# Patient Record
Sex: Female | Born: 1935
Health system: Southern US, Community
[De-identification: ages and names within clinical notes are randomized; demographics above are authoritative.]

## PROBLEM LIST (undated history)

## (undated) DIAGNOSIS — I2510 Atherosclerotic heart disease of native coronary artery without angina pectoris: Secondary | ICD-10-CM

## (undated) DIAGNOSIS — K219 Gastro-esophageal reflux disease without esophagitis: Secondary | ICD-10-CM

## (undated) DIAGNOSIS — K146 Glossodynia: Secondary | ICD-10-CM

## (undated) DIAGNOSIS — F419 Anxiety disorder, unspecified: Secondary | ICD-10-CM

## (undated) DIAGNOSIS — R1310 Dysphagia, unspecified: Secondary | ICD-10-CM

## (undated) DIAGNOSIS — G4700 Insomnia, unspecified: Secondary | ICD-10-CM

## (undated) DIAGNOSIS — I10 Essential (primary) hypertension: Secondary | ICD-10-CM

## (undated) DIAGNOSIS — I4891 Unspecified atrial fibrillation: Secondary | ICD-10-CM

## (undated) HISTORY — DX: Essential (primary) hypertension: I10

## (undated) HISTORY — DX: Unspecified atrial fibrillation: I48.91

## (undated) HISTORY — DX: Anxiety disorder, unspecified: F41.9

## (undated) HISTORY — DX: Dysphagia, unspecified: R13.10

## (undated) HISTORY — DX: Glossodynia: K14.6

## (undated) HISTORY — PX: TONSILLECTOMY: SUR1361

## (undated) HISTORY — PX: KIDNEY STONE SURGERY: SHX686

## (undated) HISTORY — DX: Atherosclerotic heart disease of native coronary artery without angina pectoris: I25.10

## (undated) HISTORY — DX: Insomnia, unspecified: G47.00

## (undated) HISTORY — PX: BREAST LUMPECTOMY: SHX2

## (undated) HISTORY — DX: Gastro-esophageal reflux disease without esophagitis: K21.9

---

## 1998-06-21 ENCOUNTER — Other Ambulatory Visit: Admission: RE | Admit: 1998-06-21 | Discharge: 1998-06-21 | Payer: Self-pay | Admitting: *Deleted

## 1999-06-24 ENCOUNTER — Other Ambulatory Visit: Admission: RE | Admit: 1999-06-24 | Discharge: 1999-06-24 | Payer: Self-pay | Admitting: *Deleted

## 1999-07-05 ENCOUNTER — Encounter: Payer: Self-pay | Admitting: *Deleted

## 1999-07-05 ENCOUNTER — Encounter: Admission: RE | Admit: 1999-07-05 | Discharge: 1999-07-05 | Payer: Self-pay | Admitting: *Deleted

## 2000-07-02 ENCOUNTER — Other Ambulatory Visit: Admission: RE | Admit: 2000-07-02 | Discharge: 2000-07-02 | Payer: Self-pay | Admitting: *Deleted

## 2000-07-06 ENCOUNTER — Encounter: Admission: RE | Admit: 2000-07-06 | Discharge: 2000-07-06 | Payer: Self-pay | Admitting: *Deleted

## 2001-07-07 ENCOUNTER — Encounter: Admission: RE | Admit: 2001-07-07 | Discharge: 2001-07-07 | Payer: Self-pay | Admitting: Obstetrics & Gynecology

## 2001-07-07 ENCOUNTER — Encounter: Payer: Self-pay | Admitting: Obstetrics & Gynecology

## 2002-08-24 ENCOUNTER — Encounter: Admission: RE | Admit: 2002-08-24 | Discharge: 2002-08-24 | Payer: Self-pay | Admitting: Family Medicine

## 2002-08-24 ENCOUNTER — Encounter: Payer: Self-pay | Admitting: Family Medicine

## 2003-09-20 ENCOUNTER — Encounter: Admission: RE | Admit: 2003-09-20 | Discharge: 2003-09-20 | Payer: Self-pay | Admitting: Family Medicine

## 2005-01-27 ENCOUNTER — Encounter: Admission: RE | Admit: 2005-01-27 | Discharge: 2005-01-27 | Payer: Self-pay | Admitting: Family Medicine

## 2006-04-21 ENCOUNTER — Encounter: Admission: RE | Admit: 2006-04-21 | Discharge: 2006-04-21 | Payer: Self-pay | Admitting: Family Medicine

## 2006-04-28 ENCOUNTER — Encounter: Admission: RE | Admit: 2006-04-28 | Discharge: 2006-04-28 | Payer: Self-pay | Admitting: Family Medicine

## 2006-12-31 ENCOUNTER — Emergency Department (HOSPITAL_COMMUNITY): Admission: EM | Admit: 2006-12-31 | Discharge: 2006-12-31 | Payer: Self-pay | Admitting: Emergency Medicine

## 2007-02-02 ENCOUNTER — Encounter (INDEPENDENT_AMBULATORY_CARE_PROVIDER_SITE_OTHER): Payer: Self-pay | Admitting: Internal Medicine

## 2007-02-02 ENCOUNTER — Inpatient Hospital Stay (HOSPITAL_COMMUNITY): Admission: EM | Admit: 2007-02-02 | Discharge: 2007-02-06 | Payer: Self-pay | Admitting: Emergency Medicine

## 2007-03-12 ENCOUNTER — Ambulatory Visit (HOSPITAL_COMMUNITY): Admission: RE | Admit: 2007-03-12 | Discharge: 2007-03-12 | Payer: Self-pay | Admitting: Interventional Cardiology

## 2007-03-12 ENCOUNTER — Encounter (INDEPENDENT_AMBULATORY_CARE_PROVIDER_SITE_OTHER): Payer: Self-pay | Admitting: Interventional Cardiology

## 2007-04-26 ENCOUNTER — Encounter: Admission: RE | Admit: 2007-04-26 | Discharge: 2007-04-26 | Payer: Self-pay | Admitting: Family Medicine

## 2007-10-14 ENCOUNTER — Encounter: Admission: RE | Admit: 2007-10-14 | Discharge: 2007-10-14 | Payer: Self-pay | Admitting: Interventional Cardiology

## 2007-10-25 ENCOUNTER — Other Ambulatory Visit: Admission: RE | Admit: 2007-10-25 | Discharge: 2007-10-25 | Payer: Self-pay | Admitting: Family Medicine

## 2008-05-05 ENCOUNTER — Encounter: Admission: RE | Admit: 2008-05-05 | Discharge: 2008-05-05 | Payer: Self-pay | Admitting: Family Medicine

## 2009-12-20 ENCOUNTER — Encounter: Admission: RE | Admit: 2009-12-20 | Discharge: 2009-12-20 | Payer: Self-pay | Admitting: Family Medicine

## 2010-07-03 ENCOUNTER — Ambulatory Visit: Payer: Self-pay | Admitting: Internal Medicine

## 2010-07-03 ENCOUNTER — Inpatient Hospital Stay (HOSPITAL_COMMUNITY): Admission: AD | Admit: 2010-07-03 | Discharge: 2010-07-06 | Payer: Self-pay | Admitting: Interventional Cardiology

## 2010-07-05 ENCOUNTER — Encounter (INDEPENDENT_AMBULATORY_CARE_PROVIDER_SITE_OTHER): Payer: Self-pay | Admitting: Interventional Cardiology

## 2010-07-10 ENCOUNTER — Encounter: Payer: Self-pay | Admitting: Internal Medicine

## 2010-07-12 ENCOUNTER — Telehealth: Payer: Self-pay | Admitting: Internal Medicine

## 2010-07-16 ENCOUNTER — Encounter (INDEPENDENT_AMBULATORY_CARE_PROVIDER_SITE_OTHER): Payer: Self-pay | Admitting: *Deleted

## 2010-07-31 ENCOUNTER — Telehealth (INDEPENDENT_AMBULATORY_CARE_PROVIDER_SITE_OTHER): Payer: Self-pay | Admitting: *Deleted

## 2010-08-09 ENCOUNTER — Encounter: Payer: Self-pay | Admitting: Internal Medicine

## 2010-08-09 ENCOUNTER — Ambulatory Visit: Admission: RE | Admit: 2010-08-09 | Discharge: 2010-08-09 | Payer: Self-pay | Source: Home / Self Care

## 2010-08-25 ENCOUNTER — Encounter: Payer: Self-pay | Admitting: Family Medicine

## 2010-09-05 NOTE — Miscellaneous (Signed)
Summary: Device preload  Clinical Lists Changes  Observations: Added new observation of PPM INDICATN: Brady (07/10/2010 13:04) Added new observation of MAGNET RTE: BOL 85 ERI 65 (07/10/2010 13:04) Added new observation of PPMLEADSTAT2: active (07/10/2010 13:04) Added new observation of PPMLEADSER2: [id number] (07/10/2010 13:04) Added new observation of PPMLEADMOD2: 5076  (07/10/2010 13:04) Added new observation of PPMLEADLOC2: RV  (07/10/2010 13:04) Added new observation of PPMLEADSTAT1: active  (07/10/2010 13:04) Added new observation of PPMLEADSER1: [id number]  (07/10/2010 13:04) Added new observation of PPMLEADMOD1: 5076  (07/10/2010 13:04) Added new observation of PPMLEADLOC1: RA  (07/10/2010 13:04) Added new observation of PPM IMP MD: Lewayne Bunting, MD  (07/10/2010 13:04) Added new observation of PPMLEADDOI2: 07/05/2010  (07/10/2010 13:04) Added new observation of PPMLEADDOI1: 07/05/2010  (07/10/2010 13:04) Added new observation of PPM DOI: 07/05/2010  (07/10/2010 13:04) Added new observation of PPM SERL#: [id number]  (07/10/2010 13:04) Added new observation of PPM MODL#: [id number]  (07/10/2010 65:78) Added new observation of PACEMAKERMFG: Medtronic  (07/10/2010 13:04) Added new observation of PACEMAKER MD: Lewayne Bunting, MD  (07/10/2010 13:04)      PPM Specifications Following MD:  Lewayne Bunting, MD     PPM Vendor:  Medtronic     PPM Model Number:  [id number]     PPM Serial Number:  [id number] PPM DOI:  07/05/2010     PPM Implanting MD:  Lewayne Bunting, MD  Lead 1    Location: RA     DOI: 07/05/2010     Model #: [id number]     Serial #: [id number]     Status: active Lead 2    Location: RV     DOI: 07/05/2010     Model #: [id number]     Serial #: [id number]     Status: active  Magnet Response Rate:  BOL 85 ERI 65  Indications:  Huston Foley

## 2010-09-05 NOTE — Progress Notes (Signed)
  Phone Note From Other Clinic   Summary of Call: per Florentina Addison at [facility] pt to be followed by [facility] for pacemaker after 3 mth ov w/gt. Vella Kohler  July 12, 2010 2:02 PM

## 2010-09-05 NOTE — Progress Notes (Signed)
  Phone Note From Other Clinic   Caller: KATY Summary of Call: PER KATY AT [facility] NO FOLLOWUP TO BE SCHEDULED HERE.  ALL PACER FOLLOWUP TO BE DONE AT [facility]. Vella Kohler  July 31, 2010 11:30 AM

## 2010-09-05 NOTE — Cardiovascular Report (Signed)
Summary: Office Visit   Office Visit   Imported By: Roderic Ovens 08/23/2010 16:28:20  _____________________________________________________________________  External Attachment:    Type:   Image     Comment:   External Document

## 2010-09-05 NOTE — Letter (Signed)
Summary: Appointment - Reminder 2  [facility], Main Office  [address]   [place], [place] [address]   Phone: [phone]  Fax: [phone]     July 16, 2010 MRN: [id number]   [name] [address] [place], [place]  [address]   Dear Ms. Packard,  Our records indicate that it is time to schedule a wound check appointment,with our Pacer Clinic, from your recent surgery with Dr. Ladona Ridgel.  Dr.Taylor also recommended that you follow up with him in 3 months. It is very important that we reach you to schedule this appointment. We look forward to participating in your health care needs. Please contact us at the number listed above at your earliest convenience to schedule your appointment.  If you are unable to make an appointment at this time, give [place] a call so we can update our records.  Unable to reach by phone at [phone]   Sincerely,   [facility] Scheduling Team

## 2010-09-05 NOTE — Procedures (Signed)
Summary: wch/d/c 07/06/2010   Current Medications (verified): 1)  Warfarin Sodium 2.5 Mg Tabs (Warfarin Sodium) .... Take As Directed By Coumadin Clinic 2)  Furosemide 40 Mg Tabs (Furosemide) .... Take 1 Tablet By Mouth Once Daily 3)  Diltiazem Hcl Er Beads 240 Mg Xr24h-Cap (Diltiazem Hcl Er Beads) .... Take 1 Capsule By Mouth Once Daily 4)  Losartan Potassium 50 Mg Tabs (Losartan Potassium) .... Take 1 Tablet By Mouth Two Times A Day 5)  Medroxyprogesterone Acetate 10 Mg Tabs (Medroxyprogesterone Acetate) .... Take 1 Tablet By Mouth Once Daily 6)  Lyrica 50 Mg Caps (Pregabalin) .... Take 1 Capusle By Mouth Two Times A Day 7)  Clonazepam 0.5 Mg Tabs (Clonazepam) .... Take 1 Tablet By Mouth Three Times A Day As Needed 8)  Premarin 0.625 Mg/gm Crea (Estrogens, Conjugated) .... Take 1 Tablet By Mouth Once Daily For 3 Weeks, Off 1 Week Then Repeat Days 1-25 9)  Melatonin 3 Mg Tabs (Melatonin) .... Take 1 Tablet By Mouth Once Daily 10)  Metanx 3-35-2 Mg Tabs (L-Methylfolate-B6-B12) .... Take 1 Tablet By Mouth Once Daily 11)  Calcium Carbonate-Vitamin D 600-400 Mg-Unit Tabs (Calcium Carbonate-Vitamin D) .... Take 1 Tablet By Mouth Two Times A Day 12)  Fish Oil 1000 Mg Caps (Omega-3 Fatty Acids) .... Take 2 Capsules By Mouth Once Daily 13)  Coq10 30 Mg Caps (Coenzyme Q10) .... Take 1 Capusle By Mouth Once Daily 14)  Magnesium 250 Mg Tabs (Magnesium) .... Take 1 Tablet By Mouth Once Daily 15)  Alpha-Lipoic Acid 200 Mg Caps (Alpha-Lipoic Acid) .... Take 1 Tablet By Mouth Once Daily 16)  Folic Acid 800 Mcg Tabs (Folic Acid) .... Take 1 Tablet By Mouth Once Daily 17)  Glucosamine-Chondroitin 250-200 Mg Caps (Glucosamine-Chondroitin) .... Take 2 Tablets By Mouth Once Daily 18)  Vitamin C 500 Mg Tabs (Ascorbic Acid) .... Take 1 Tablet By Mouth Once Daily 19)  B Complex  Tabs (B Complex Vitamins) .... Take 1 Tablet By Mouth Once Daily 20)  Zoloft 100 Mg Tabs (Sertraline Hcl) .... Take 1/4 To 1/2 Tablet By  Mouth Once Daily  Allergies (verified): 1)  ! Cortisone  PPM Specifications Following MD:  Lewayne Bunting, MD     PPM Vendor:  [employer]tronic     PPM Model Number:  [id number]     PPM Serial Number:  [id number] PPM DOI:  07/05/2010     PPM Implanting MD:  Lewayne Bunting, MD  Lead 1    Location: RA     DOI: 07/05/2010     Model #: [id number]     Serial #: [id number]     Status: active Lead 2    Location: RV     DOI: 07/05/2010     Model #: [id number]     Serial #: [id number]     Status: active  Magnet Response Rate:  BOL 85 ERI 65  Indications:  Huston Foley   PPM Follow Up Battery Voltage:  2.78 V     Battery Est. Longevity:  8.5 YRS       PPM Device Measurements Atrium  Amplitude: 2.00 mV, Impedance: 590 ohms, Threshold: 1.00 V at 0.40 msec Right Ventricle  Amplitude: 15.68 mV, Impedance: 828 ohms, Threshold: 0.50 V at 0.40 msec  Episodes MS Episodes:  0     Ventricular High Rate:  0     Atrial Pacing:  99.4%     Ventricular Pacing:  99.8%  Parameters Mode:  DDDR     Lower Rate Limit:  60  Upper Rate Limit:  130 Paced AV Delay:  150     Sensed AV Delay:  120 Tech Comments:  WOUND CHECK---SITE HEALED WITH NO REDNESS OR SWELLING.  NORMAL DEVICE FUNCTION.  VP 99.8%---CHANGED PAV DELAY TO 350 TO PROMOTE ON INTRINSIC.  PT TO BE FOLLOWED BY [facility]--3 MTH FOLLOWUP TO BE SCHEDULED THERE. Vella Kohler  August 09, 2010 10:27 AM

## 2010-10-15 LAB — BASIC METABOLIC PANEL
BUN: 13 mg/dL (ref 6–23)
BUN: 13 mg/dL (ref 6–23)
BUN: 15 mg/dL (ref 6–23)
CO2: 27 mEq/L (ref 19–32)
CO2: 28 mEq/L (ref 19–32)
CO2: 29 mEq/L (ref 19–32)
Calcium: 8.8 mg/dL (ref 8.4–10.5)
Chloride: 86 mEq/L — ABNORMAL LOW (ref 96–112)
Chloride: 90 mEq/L — ABNORMAL LOW (ref 96–112)
Chloride: 92 mEq/L — ABNORMAL LOW (ref 96–112)
Creatinine, Ser: 0.75 mg/dL (ref 0.4–1.2)
Creatinine, Ser: 0.77 mg/dL (ref 0.4–1.2)
Creatinine, Ser: 0.82 mg/dL (ref 0.4–1.2)
GFR calc Af Amer: >60 mL/min (ref >60)
GFR calc Af Amer: >60 mL/min (ref >60)
GFR calc Af Amer: >60 mL/min (ref >60)
GFR calc Af Amer: >60 mL/min (ref >60)
GFR calc non Af Amer: >60 mL/min (ref >60)
Glucose, Bld: 109 mg/dL — ABNORMAL HIGH (ref 70–99)
Potassium: 3.7 mEq/L (ref 3.5–5.1)
Sodium: 123 mEq/L — ABNORMAL LOW (ref 135–145)
Sodium: 124 mEq/L — ABNORMAL LOW (ref 135–145)
Sodium: 129 mEq/L — ABNORMAL LOW (ref 135–145)

## 2010-10-15 LAB — PROTIME-INR: INR: 2.93 — ABNORMAL HIGH (ref 0.00–1.49)

## 2010-10-16 LAB — DIFFERENTIAL
Basophils Relative: 0 % (ref 0–1)
Eosinophils Absolute: 0 10*3/uL (ref 0.0–0.7)
Eosinophils Relative: 0 % (ref 0–5)
Monocytes Absolute: 0.8 10*3/uL (ref 0.1–1.0)
Monocytes Relative: 9 % (ref 3–12)
Neutro Abs: 7.2 10*3/uL (ref 1.7–7.7)

## 2010-10-16 LAB — COMPREHENSIVE METABOLIC PANEL
ALT: 168 U/L — ABNORMAL HIGH (ref 0–35)
AST: 109 U/L — ABNORMAL HIGH (ref 0–37)
Albumin: 3.7 g/dL (ref 3.5–5.2)
Alkaline Phosphatase: 86 U/L (ref 39–117)
BUN: 13 mg/dL (ref 6–23)
Calcium: 8.4 mg/dL (ref 8.4–10.5)
Chloride: 84 mEq/L — ABNORMAL LOW (ref 96–112)
Creatinine, Ser: 0.7 mg/dL (ref 0.4–1.2)
Total Bilirubin: 0.8 mg/dL (ref 0.3–1.2)
Total Protein: 6.6 g/dL (ref 6.0–8.3)

## 2010-10-16 LAB — CBC
HCT: 36.8 % (ref 36.0–46.0)
MCH: 31.6 pg (ref 26.0–34.0)
MCHC: 36.1 g/dL — ABNORMAL HIGH (ref 30.0–36.0)
Platelets: 202 10*3/uL (ref 150–400)
RBC: 4.21 MIL/uL (ref 3.87–5.11)
RDW: 14.9 % (ref 11.5–15.5)
WBC: 9 10*3/uL (ref 4.0–10.5)

## 2010-10-16 LAB — PROTIME-INR: INR: 2.75 — ABNORMAL HIGH (ref 0.00–1.49)

## 2010-12-17 NOTE — Consult Note (Signed)
NAMEANJELICA, [name] NO.:  [id number]   MEDICAL RECORD NO.:  [id number]          PATIENT TYPE:  INP   LOCATION:  1410                         FACILITY:  [facility]   PHYSICIAN:  [name], M.D.   DATE OF BIRTH:  May 15, 1919   DATE OF CONSULTATION:  02/03/2007  DATE OF DISCHARGE:                                 CONSULTATION   We were asked to see [name] today in consultation by Dr. Suanne Marker for  esophageal reflux.   HISTORY OF PRESENT ILLNESS:  This is an 77 year old female in excellent  health who has experienced reflux since last fall. It has become much  more severe in the last 6 weeks. She was admitted to [facility] on July 1 for rapid heart rate. The patient tells me that she  has lost 6 pounds in 6 weeks which is significant because she is at a  low weight to begin with. She was gone from 127-121. She also tells me  that she used to do daily exercise, walk approximately two miles a day  and do sit ups. Lately she has stopped exercising due to her symptoms of  burning in her mouth.  She has lost sleep and experiences frequent  interruptions of sleep due to her symptoms.  She has had diarrhea which  has been helped with Pepto-Bismol. The patient.  questioned if her  diarrhea could have been due to Nexium and Reglan. She describes her  symptoms as severe acid taste and burning on her tongue.  She denies any  dysphasia, loss of appetite. States that eating a little actually helps  her symptoms.  She denies any epigastric or abdominal pain.  Denies any  nausea or vomiting or hematemesis.  She denies any melena or  hematochezia.  Denies any back pain, recent illness, or sick contacts.  The patient takes no NSAIDs.  She does use Tums and Pepto-Bismol along  with Nexium and Reglan for her symptoms.  She recently stopped the  Reglan due to her rapid heart rate.  The patient describes her self as  having a driver-driver type personality and possibly a  high stress  level. She reports that she started drinking lots of V8 juice 6-8 weeks  ago, also reports eating a good deal of ascitic foods.   PAST MEDICAL HISTORY:  Significant for:  1. Nephrolithiasis.  2. Hypertension.  3. GERD.  4. She had a tonsillectomy.  5. Left breast lumpectomy.  6. 25+ years ago she had an endoscopy which was positive for hiatal      hernia.  7. Approximately 5-8 years ago she had a colonoscopy done by the      [facility]. She did not report the results of that colonoscopy.   She reports that she has a sister who passed with colon cancer.   CURRENT MEDICATIONS:  1. Nexium 40 b.i.d.  2. Reglan 5 mg 3 times a day.  3. Zantac.  4. Tums.  5. Daily Pepto-Bismol.  6. Hydrochlorothiazide/triamalprazolam.  7. Vitamins E, C, A, chromium, glucosamine and chondroitin, calcium,  iron, folic acid.   ALLERGIES:  The patient is allergic to CORTISONE.   REVIEW OF SYSTEMS:  Significant for concern about the looks of her  tongue.  Admitted with a rapid irregular heart rate.   SOCIAL HISTORY:  No alcohol.  No tobacco.  Married with five children  and grandchildren.  A very active person.   FAMILY HISTORY:  Sister with colon cancer.   PHYSICAL EXAMINATION:  GENERAL:  Alert and oriented in no apparent  distress.  Family is present.  VITAL SIGNS:  Temperature 98.1, pulse 71, respirations 18, blood  pressure is 147/93.  CARDIOVASCULAR:  Sounds tachy and irregular.  LUNGS:  Clear to auscultation anteriorly.  ABDOMEN:  Increased active bowel sounds, nontender, nondistended.  No  masses.  RECTAL:  Exam showed no masses.  No stool. Fluid was guaiac negative.   Current labs show a sodium of 127, potassium 3.9, BUN 19, creatinine  0.64, glucose 119.  TSH 0.879.  Her hemoglobin was 14.9 on admission.   ASSESSMENT:  Dr. Wandalee Ferdinand has seen and examined the patient, collected  a history.  He believes this is acid reflux, possibly due to a hiatal  hernia.  Furthermore, the patient has hyponatremia and hypertension.   PLAN:  Per family's request. will do upper endoscopy tomorrow morning at  approximately 9:00 a.m.   Thanks very much for this consultation.      Stephani Police, PA    ______________________________  [name], M.D.    MLY/MEDQ  D:  02/03/2007  T:  02/04/2007  Job:  [id number]   cc:   L. [name], M.D.  Fax: [phone]   Kela Millin, M.D.

## 2010-12-17 NOTE — Op Note (Signed)
NAMEELANE, PEABODY NO.:  [id number]   MEDICAL RECORD NO.:  [id number]          PATIENT TYPE:  INP   LOCATION:  1410                         FACILITY:  [facility]   PHYSICIAN:  Graylin Shiver, M.D.   DATE OF BIRTH:  [birth year]   DATE OF PROCEDURE:  02/04/2007  DATE OF DISCHARGE:  02/06/2007                               OPERATIVE REPORT   Esophagogastroduodenoscopy with biopsy.   INDICATIONS:  Heartburn.   Informed consent was obtained after explanation of the risks of  bleeding, infection and perforation.   PREMEDICATION:  Fentanyl 50 mcg IV, Versed 3 mg IV.   PROCEDURE:  With the patient in the left lateral decubitus position, the  Pentax gastroscope was inserted into the oropharynx and passed into the  esophagus.  It was advanced down the esophagus, then into the stomach  and into the duodenum.  The second portion and bulb of the duodenum were  normal.  The stomach showed some mild hyperemia in the antrum.  A biopsy  was obtained for CLO-test.  The stomach also showed a hiatal hernia,  which was very small.  The esophagus looked normal in its entirety.  She  tolerated the procedure well without complications.   IMPRESSION:  1. One mild hyperemia of the antrum.  2. Small hiatal hernia.           ______________________________  Graylin Shiver, M.D.     SFG/MEDQ  D:  02/08/2007  T:  02/09/2007  Job:  [id number]

## 2010-12-17 NOTE — Discharge Summary (Signed)
[name], [name]              ACCOUNT NO.:  [id number]   MEDICAL RECORD NO.:  [id number]          PATIENT TYPE:  INP   LOCATION:  1410                         FACILITY:  [facility]   PHYSICIAN:  Kela Millin, M.D.DATE OF BIRTH:  Apr 29, 1919   DATE OF ADMISSION:  02/01/2007  DATE OF DISCHARGE:  02/06/2007                               DISCHARGE SUMMARY   DISCHARGE DIAGNOSES:  1. Atrial fibrillation, new onset.  2. Gastroesophageal reflux disease with a small hiatal hernia per EGD.  3. Hypertension.  4. Hyponatremia.  5. History of nephrolithiasis.  6. History of left breast cyst - status post excision.   PROCEDURE/STUDIES:  1. 2D echocardiogram - overall left ventricular systolic function      normal.  Ejection fraction is stated at 60 percent.  No left      ventricular regional wall function abnormality.  Moderate pulmonary      hypertension noted.  Right ventricular systolic pressure is 50.  2. EGD done on 02/04/2007 - Per Dr. Evette Cristal, small hiatal hernia, mild      hyperemia, biopsy with Clo test done.  Otherwise normal EGD.  Clo      test negative.   CONSULTATIONS:  1. Cardiology - Corky Crafts, MD  2. Gastroenterology - Graylin Shiver, M.D.   HISTORY/ADMISSION PHYSICAL EXAM:  The patient is an 77 year old white  female who presented with complaints of her heart racing and mild  shortness of breath.  She reported that thereafter she subsequently  after dinner, returned to lay down in bed and noticed again that her  heart was racing.  She called her pharmacist to find out about drug  reactions between Nexium and Reglan.  Because of her persistent rapid  heart rate, she went to the [facility] and she was found to be  in atrial fibrillation with rapid ventricular response and was referred  to the [facility] Emergency Room.   Her physical exam upon admission as per Dr. Debby Bud reveals in general,  well-nourished, well-developed white female looking younger  than her  stated age in no acute distress.  Person's findings on exam were on  cardiovascular exam noted to have a very rapid tachycardia which seemed  regular on exam.  Her lungs were clear to auscultation no crackles, no  wheezes or rhonchi.  The rest of the physical exam was reported to be  within normal limits.   LABORATORY DATA:  The laboratory data hemoglobin was 14.9, white blood  cells of 6.5, platelet count of 330, sodium 127, potassium 3.9, chloride  92, CO2 27, BUN of 19, creatinine 0.6, glucose 119.  Her point of care  markers were negative.  Chest x-ray question of early COPD changes.  No  infiltrates or effusion.  On EKG done at the [facility] reveals  atrial fibrillation at a rate 149 and the second EKG at the [facility]  ED reveals atrial fibrillation at the rate of 139.   HOSPITAL COURSE:  1. New onset atrial fibrillation with rapid ventricular response -      Upon admission the patient was started  on a Cardizem drip and      serial cardiac enzymes done to evaluate for MI.  Her cardiac      enzymes were negative for MI.  A 2D echo was done as a result of      series above.  Her heart rate was subsequently controlled on the IV      Cardizem and the patient was then changed to oral Cardizem and      metoprolol also added.  Cardiology was consulted and it was noted      that the patient had had a heart rate drop into the 30s overnight      and so the metoprolol was discontinued and her long acting Cardizem      changed to short acting.  The patient was started on      anticoagulation with heparin upon admission and subsequently      Coumadin also started.  The patient was monitored on telemetry and      off the metoprolol no further bradycardic episodes were recorded      and on followup rounds today Dr. Patty Sermons switched the patient      back to a long acting diltiazem 240 mg and she is to continue this      upon discharge.  The patient's PT INR also monitored  while she was      on anticoagulation and today it became therapeutic at 2.1.  The      heparin has been discontinued and the patient will be discharged on      oral Coumadin at this time.  She is to go to the [facility] Coumadin      Clinic on 02/08/2007 for PT INR and the Coumadin dose to be adjusted      as appropriate.  The patient has been ambulating in the hospital      with no further palpitations, also no shortness of breath.  She has      remained hemodynamically stable and will be discharged at this time      to follow up as an outpatient.  Per Dr. Eldridge Dace if the patient      remains in atrial fibrillation he will consider a cardioversion      after several weeks of Coumadin as outpatient.  2. Gastroesophageal reflux disease - the patient was complaining of      tongue burning and had been on Nexium and Reglan.  Also had been      seen by Dr. Evette Cristal.  The patient's symptoms persisted while in the      hospital and the Reglan had been discontinued because of the      potential for supraventricular tachycardia and the patient was      started on Carafate while in the hospital.  Because her symptoms      persisted, gastroenterology was consulted and Dr. Evette Cristal saw the      patient in the hospital and that EGD was done on 02/04/2007 and the      results stated above.  The patient reported that her symptoms were      better controlled with the Carafate and GI has recommended that she      continues the Nexium as well upon discharge.  3. Hypertension - the patient's blood pressure is well controlled      while in the hospital on her outpatient medications as well as the      diltiazem as above.  4. Hyponatremia -  the patient was treated while in the hospital.      Impression is that this was likely secondary to Maxzide.  The      patient is to follow up with her primary care physician to have her      sodium rechecked and if the hyponatremia is persistent then the      recommendation would  be for the Maxzide to be discontinued per      primary care physician.  The patient remained asymptomatic in the      hospital.   DISCHARGE MEDICATIONS:  1. Diltiazem CD 240 mg one p.o. daily.  2. Coumadin 2.5 mg p.o. q.p.m., PT INR's to be checked at Coumadin      clinic on 02/08/2007 and they will adjust it as appropriate.  3. Carafate 1 gm p.o. q.a.c.  4. The patient to continue outpatient medications - Maxzide to follow      up with her primary care physician of, Nexium and Xanax.  5. The patient instructed to discontinue Reglan.   FOLLOWUP CARE:  1. Coumadin Clinic on 7/7 for PT INR.  2. Dr. Eldridge Dace - the patient to call for appointment.  3. Dr. [name] - in one week, the patient is to call for      appointment.   DISCHARGE CONDITION:  Improved - stable.      Kela Millin, M.D.  Electronically Signed     ACV/MEDQ  D:  02/06/2007  T:  02/06/2007  Job:  [id number]   cc:   L. [name], M.D.  Fax: [phone]   Corky Crafts, MD  Fax: [phone]   Graylin Shiver, M.D.  Fax: [phone]

## 2010-12-17 NOTE — H&P (Signed)
NAMELUDENE, STOKKE NO.:  [id number]   MEDICAL RECORD NO.:  [id number]          PATIENT TYPE:  INP   LOCATION:  1234                         FACILITY:  [facility]   PHYSICIAN:  Rosalyn Gess. Norins, MD  DATE OF BIRTH:  1919/02/11   DATE OF ADMISSION:  02/01/2007  DATE OF DISCHARGE:                              HISTORY & PHYSICAL   CHIEF COMPLAINT:  Rapid heart rate with mild shortness of breath.   HISTORY OF PRESENT ILLNESS:  Ms. Ruland is a delightful 77 year old  married white female who has noticed this evening when lying down that  her heart rate was rapid.  She was able to get up from the bed, go to  dinner, and had no symptoms.  After cleaning up after dinner she  returned to bed to lay down and noticed that her heart rate was rapid  again.  She did call her pharmacist to check to see about drug  interactions between Nexium and Reglan.  Because of persistent rapid  heart rate, she went to the [facility] after hours clinic where she was found  to be in atrial fibrillation with rapid ventricular response and was  subsequently referred to the [facility] emergency department.  On  arrival in the emergency department, she was found to be in rapid atrial  fibrillation and was started on a Cardizem drip.  She is now admitted  for rate control and further evaluation.  Of note, the patient denies  any history of any cardiac problems of any kind.  She has been followed  for the past month for severe reflux which has been treated with PPIs  and Reglan recently started.  She has had a consult with Dr. Evette Cristal.   PAST SURGICAL HISTORY:  1. Tonsillectomy, remote.  2. Surgical removal of kidney stone, remote.  3. Excision of a cyst from the left breast.   PAST MEDICAL HISTORY:  1. Usual childhood diseases and whooping cough.  2. Hypertension.  3. GERD.   CURRENT MEDICATIONS:  1. Xanax 0.25 mg t.i.d. p.r.n.  2. Metoclopramide 5 mg before meals.  3. Nexium 40 mg q.a.m.  4.  Maxzide 37.5/25.   HABITS:  Tobacco:  None.  Alcohol:  None.   FAMILY HISTORY:  Noncontributory.   SOCIAL HISTORY:  The patient lives with her husband.  They are very  independent in their activities of daily living.  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   REVIEW OF SYSTEMS:  Negative for any HEENT, cardiovascular, respiratory,  GI, GU, or musculoskeletal complaints, except as per the HPI.   PHYSICAL EXAMINATION:  VITAL SIGNS:  Temperature 97.1, blood pressure  143/102, heart rate was 137-140, respirations were 20, O2 saturations  98% on room air.  GENERAL:  Well-nourished, well-developed Caucasian female looking  younger than her stated chronologic age, in no acute distress.  HEENT:  Normocephalic, atraumatic.  Conjunctivae and sclerae were clear.  Oropharynx with native dentition in good repair.  No buccal or palatal  lesions were noted.  Posterior pharynx was clear.  NECK:  Supple without thyromegaly.  No lymphadenopathy was noted  in the  cervical or supraclavicular regions.  CHEST:  No deformities were noted.  LUNGS:  Clear with no rales, wheezes, or rhonchi.  BREASTS:  Exam deferred to outpatient evaluation.  CARDIOVASCULAR:  2+ radial pulse.  She had a quiet precordium with no  heave, no thrill.  She had no JVD, no carotid bruits.  The patient had a  very rapid tachycardia which on auscultation seemed regular.  ABDOMEN:  Soft.  No guarding or rebound.  No organosplenomegaly was  noted.  No masses were noted.  PELVIC/RECTAL:  Exams deferred.  EXTREMITIES:  Without clubbing, cyanosis, edema, or deformity.  NEUROLOGIC:  The patient is awake, alert, oriented to person, place,  time, and context, and her exam is nonfocal.   DATABASE:  Hemoglobin 14.9 grams, white count 6500, platelet count  330,000.  Chemistries with sodium 127, potassium 3.9, chloride 92, CO2  24, BUN 19, creatinine 0.6, glucose 119.  CK-MB was 6.6 with troponin  less than 0.05.  CK #2 was 5.8 with  troponin less than 0.05.   Chest x-ray with question of early COPD with no infiltrates or  effusions.  12-lead electrocardiogram #1 performed at [facility] outpatient  was atrial fibrillation with rate of 149.  EKG #2 in the [facility] ED  was atrial fibrillation with a rate of 139.   ASSESSMENT AND PLAN:  Cardiovascular.  Patient with new onset atrial  fibrillation of unknown duration.  The patient remains asymptomatic and  has had no chest pain, no shortness of breath, no syncope, near syncope,  or other symptoms.  On physical exam, aside from her tachycardia, she is  stable.  Plan:  Continue Cardizem intravenously.  We will add thyroid-  stimulating hormone to the patient's laboratory and do regular cycled  enzymes x3.  Will perform a 2-D echocardiogram in the a.m. by [facility]  Cardiology and ask for an [facility] Cardiology consultation.  The patient  will be covered with Lovenox 40 mg subcutaneously every 12 hours,  holding the full anticoagulation until after initial evaluation is  completed.  1. Hypertension.  The patient's blood pressure was elevated initially,      but we will continue with her Cardizem as well as with her Maxzide.      We will add Lopressor 12.5 mg by mouth twice a day for both      pressure and rate control.  2. Gastroesophageal reflux disease.  Patient with severe      gastroesophageal reflux disease and has been on Nexium and Reglan.      Because of the potential for supraventricular tachycardia with      Reglan, will hold at this time.  Will continue Protonix 40 mg every      morning with ranitidine 150 mg every evening as needed.      Rosalyn Gess Norins, MD  Electronically Signed     MEN/MEDQ  D:  02/02/2007  T:  02/02/2007  Job:  [id number]   cc:   L. [name], M.D.  Fax: [phone]

## 2010-12-17 NOTE — Consult Note (Signed)
NAMEBREEA, [name] NO.:  [id number]   MEDICAL RECORD NO.:  [id number]          PATIENT TYPE:  INP   LOCATION:  1410                         FACILITY[facility]  [facility]   PHYSICIAN:  Corky Crafts, MDDATE OF BIRTH:  03/03/1919   DATE OF CONSULTATION:  02/04/2007  DATE OF DISCHARGE:                                 CONSULTATION   REFERRING PHYSICIAN:  Kela Millin, M.D.   REASON FOR CONSULTATION:  Atrial fibrillation.   HISTORY OF PRESENT ILLNESS:  The patient is a 77 year old woman who is  admitted with palpitations.  She was found to be in atrial fibrillation  with rapid ventricular response.  She has been rate-controlled with  Cardizem.  She has been anticoagulated with heparin.  She was also  started on Lopressor.  She did have some heart rates into the 30s last  evening.  She was not symptomatic with it.  She has not had any chest  pain or shortness of breath.  She has really been in good health  overall.  She does report some heartburn type symptoms as well.   PAST MEDICAL HISTORY:  1. Hypertension.  2. GERD.  3. Nephrolithiasis.   PAST SURGICAL HISTORY:  1. Tonsillectomy.  2. Lumpectomy.   CURRENT MEDICATIONS:  1. Nexium 40 mg b.i.d.  2. Reglan.  3. Zantac.  4. Tums.  5. Pepto-Bismol.  6. HCTZ/triamterene.  7. Multiple vitamins.   ALLERGIES:  CORTISONE.   SOCIAL HISTORY:  She does not smoke or drink.  She is married with five  children.  She lives with her husband.  She is very active physically.   FAMILY HISTORY:  No early coronary artery disease.   REVIEW OF SYSTEMS:  The focal weakness.  No rash.  She does have  heartburn symptoms.  She has had some diarrhea.  She denies chest pain  or shortness of breath.  She has had palpitations.  She denies any rash.  All other systems negative.   PHYSICAL EXAMINATION:  VITAL SIGNS:  Blood pressure 127/75, heart rate  ranging from 30s-110.  GENERAL:  She is awake, alert, in no apparent  distress.  HEAD:  Normocephalic, atraumatic. Extraocular is intact.  NECK:  No JVD.  CARDIAC:  Irregularly, irregular rhythm, S1, S2.  LUNGS:  Clear to auscultation bilaterally.  ABDOMEN:  Soft, nontender.  EXTREMITIES:  No edema.  NEUROLOGIC:  No focal deficits.  SKIN:  No rash.   LABORATORY DATA AND X-RAY FINDINGS:  Troponin 0.03 which is normal, CK-  MB 7.3 with a CK of 174.  Additional troponin less than 0.05.  Potassium  3.9, creatinine 0.6.   EKG showed atrial fibrillation with rapid ventricular response.   ASSESSMENT:  An 77 year old with new onset atrial fibrillation.   RECOMMENDATIONS:  1. Continue rate control.  I would decrease her Cardizem to 30 mg q.6      h. given the very slow heart rate that she had.  Will also stop her      beta-blocker.  2. We discussed Coumadin with the patient and her family.  She does  not have any bleeding contraindications.  She is not unsteady on      her feet.  Will initiate Coumadin therapy and heparin      anticoagulation.  3. The patient is also being evaluated by GI for reflux symptoms.  4. Hopefully, the patient will convert back to normal sinus rhythm on      her own.  If she does not, will plan to anticoagulate her for about      a month and then cardiovert her as an outpatient.  5. Will follow the patient while she is in the hospital.      Corky Crafts, MD  Electronically Signed     JSV/MEDQ  D:  02/04/2007  T:  02/05/2007  Job:  [id number]   cc:   L. [name], M.D.  Fax: [phone]

## 2010-12-17 NOTE — Op Note (Signed)
NAMEALIZ, MERITT NO.:  [id number]   MEDICAL RECORD NO.:  [id number]          PATIENT TYPE:  AMB   LOCATION:  ENDO                         FACILITY:  [facility]   PHYSICIAN:  Corky Crafts, MDDATE OF BIRTH:  February 19, 1919   DATE OF PROCEDURE:  03/12/2007  DATE OF DISCHARGE:                               OPERATIVE REPORT   PROCEDURE PERFORMED:  DC cardioversion.   OPERATOR:  Corky Crafts, M.D.   INDICATION:  Atrial fibrillation.   PROCEDURE:  The risks and benefits to cardioversion were explained to  the patient and informed consent was obtained.  The TEE did not show any  intracardiac thrombus.  Defibrillator pads were then placed on the  anterior chest wall and back.  A single 120 joule biphasic shock was  successful in restoring normal sinus rhythm.  There were no apparent  complications.  Anesthesia was administered by Dr. Kathline Magic.   IMPRESSION:  Successful restoration of normal sinus rhythm.   RECOMMENDATIONS:  Continue Coumadin and Cardizem for now.  I will follow  up with the patient in the office.      Corky Crafts, MD  Electronically Signed     JSV/MEDQ  D:  03/12/2007  T:  03/13/2007  Job:  [id number]

## 2011-05-20 LAB — BASIC METABOLIC PANEL
BUN: 16
Chloride: 97
Creatinine, Ser: 0.58
Glucose, Bld: 110 — ABNORMAL HIGH

## 2011-05-20 LAB — CBC
HCT: 40.9
HCT: 41.4
HCT: 42.7
Hemoglobin: 14.2
Hemoglobin: 14.4
MCHC: 34.6
MCHC: 34.8
MCHC: 34.8
MCV: 91.5
MCV: 91.9
MCV: 92.7
Platelets: 295
Platelets: 303
Platelets: 310
RDW: 13.8
RDW: 13.8
RDW: 13.9
WBC: 7.1

## 2011-05-20 LAB — HEPARIN LEVEL (UNFRACTIONATED)
Heparin Unfractionated: 0.57
Heparin Unfractionated: 0.64

## 2011-05-20 LAB — CARDIAC PANEL(CRET KIN+CKTOT+MB+TROPI)
CK, MB: 4.9 — ABNORMAL HIGH
CK, MB: 4.9 — ABNORMAL HIGH
Total CK: 106
Troponin I: 0.04

## 2011-05-20 LAB — CK TOTAL AND CKMB (NOT AT ARMC): Relative Index: 4.2 — ABNORMAL HIGH

## 2011-05-20 LAB — PROTIME-INR: INR: 2.1 — ABNORMAL HIGH

## 2011-05-20 LAB — CLOTEST (H. PYLORI), BIOPSY: Helicobacter screen: NEGATIVE

## 2011-05-21 LAB — POCT CARDIAC MARKERS
CKMB, poc: 5.8
Myoglobin, poc: 117
Myoglobin, poc: 98.8
Operator id: 4661
Operator id: 4761

## 2011-05-21 LAB — PROTIME-INR
INR: 1
Prothrombin Time: 12.8

## 2011-05-21 LAB — CBC
HCT: 43.4
MCHC: 34.2
MCV: 93.3
Platelets: 330
WBC: 6.5

## 2011-05-21 LAB — DIFFERENTIAL
Basophils Relative: 1
Eosinophils Absolute: 0.1
Eosinophils Relative: 2
Lymphs Abs: 1.6
Monocytes Relative: 14 — ABNORMAL HIGH
Neutrophils Relative %: 58

## 2011-05-21 LAB — BASIC METABOLIC PANEL
BUN: 19
CO2: 24
Chloride: 92 — ABNORMAL LOW
Creatinine, Ser: 0.64
Potassium: 3.9

## 2011-08-13 DIAGNOSIS — I4891 Unspecified atrial fibrillation: Secondary | ICD-10-CM | POA: Diagnosis not present

## 2011-08-13 DIAGNOSIS — Z7901 Long term (current) use of anticoagulants: Secondary | ICD-10-CM | POA: Diagnosis not present

## 2011-09-03 DIAGNOSIS — Z79899 Other long term (current) drug therapy: Secondary | ICD-10-CM | POA: Diagnosis not present

## 2011-09-03 DIAGNOSIS — I2510 Atherosclerotic heart disease of native coronary artery without angina pectoris: Secondary | ICD-10-CM | POA: Diagnosis not present

## 2011-09-03 DIAGNOSIS — I4891 Unspecified atrial fibrillation: Secondary | ICD-10-CM | POA: Diagnosis not present

## 2011-09-03 DIAGNOSIS — Z7901 Long term (current) use of anticoagulants: Secondary | ICD-10-CM | POA: Diagnosis not present

## 2011-10-07 DIAGNOSIS — I4891 Unspecified atrial fibrillation: Secondary | ICD-10-CM | POA: Diagnosis not present

## 2011-10-07 DIAGNOSIS — Z7901 Long term (current) use of anticoagulants: Secondary | ICD-10-CM | POA: Diagnosis not present

## 2011-10-07 DIAGNOSIS — Z950 Presence of cardiac pacemaker: Secondary | ICD-10-CM | POA: Diagnosis not present

## 2011-11-11 DIAGNOSIS — F411 Generalized anxiety disorder: Secondary | ICD-10-CM | POA: Diagnosis not present

## 2011-11-11 DIAGNOSIS — Z23 Encounter for immunization: Secondary | ICD-10-CM | POA: Diagnosis not present

## 2011-11-11 DIAGNOSIS — N951 Menopausal and female climacteric states: Secondary | ICD-10-CM | POA: Diagnosis not present

## 2011-11-11 DIAGNOSIS — I10 Essential (primary) hypertension: Secondary | ICD-10-CM | POA: Diagnosis not present

## 2011-11-11 DIAGNOSIS — G4700 Insomnia, unspecified: Secondary | ICD-10-CM | POA: Diagnosis not present

## 2011-11-11 DIAGNOSIS — R1311 Dysphagia, oral phase: Secondary | ICD-10-CM | POA: Diagnosis not present

## 2011-12-03 DIAGNOSIS — I4891 Unspecified atrial fibrillation: Secondary | ICD-10-CM | POA: Diagnosis not present

## 2011-12-03 DIAGNOSIS — Z7901 Long term (current) use of anticoagulants: Secondary | ICD-10-CM | POA: Diagnosis not present

## 2012-01-13 DIAGNOSIS — I10 Essential (primary) hypertension: Secondary | ICD-10-CM | POA: Diagnosis not present

## 2012-01-13 DIAGNOSIS — I4891 Unspecified atrial fibrillation: Secondary | ICD-10-CM | POA: Diagnosis not present

## 2012-01-13 DIAGNOSIS — Z7901 Long term (current) use of anticoagulants: Secondary | ICD-10-CM | POA: Diagnosis not present

## 2012-02-27 DIAGNOSIS — Z7901 Long term (current) use of anticoagulants: Secondary | ICD-10-CM | POA: Diagnosis not present

## 2012-02-27 DIAGNOSIS — I4891 Unspecified atrial fibrillation: Secondary | ICD-10-CM | POA: Diagnosis not present

## 2012-03-19 DIAGNOSIS — I4891 Unspecified atrial fibrillation: Secondary | ICD-10-CM | POA: Diagnosis not present

## 2012-03-19 DIAGNOSIS — Z7901 Long term (current) use of anticoagulants: Secondary | ICD-10-CM | POA: Diagnosis not present

## 2012-04-08 DIAGNOSIS — Z950 Presence of cardiac pacemaker: Secondary | ICD-10-CM | POA: Diagnosis not present

## 2012-04-23 DIAGNOSIS — Z7901 Long term (current) use of anticoagulants: Secondary | ICD-10-CM | POA: Diagnosis not present

## 2012-04-23 DIAGNOSIS — I4891 Unspecified atrial fibrillation: Secondary | ICD-10-CM | POA: Diagnosis not present

## 2012-05-21 DIAGNOSIS — J209 Acute bronchitis, unspecified: Secondary | ICD-10-CM | POA: Diagnosis not present

## 2012-05-21 DIAGNOSIS — R069 Unspecified abnormalities of breathing: Secondary | ICD-10-CM | POA: Diagnosis not present

## 2012-05-24 DIAGNOSIS — I4891 Unspecified atrial fibrillation: Secondary | ICD-10-CM | POA: Diagnosis not present

## 2012-05-24 DIAGNOSIS — J209 Acute bronchitis, unspecified: Secondary | ICD-10-CM | POA: Diagnosis not present

## 2012-06-01 DIAGNOSIS — I4891 Unspecified atrial fibrillation: Secondary | ICD-10-CM | POA: Diagnosis not present

## 2012-06-01 DIAGNOSIS — J209 Acute bronchitis, unspecified: Secondary | ICD-10-CM | POA: Diagnosis not present

## 2012-06-14 DIAGNOSIS — R5381 Other malaise: Secondary | ICD-10-CM | POA: Diagnosis not present

## 2012-06-14 DIAGNOSIS — R5383 Other fatigue: Secondary | ICD-10-CM | POA: Diagnosis not present

## 2012-06-22 DIAGNOSIS — Z7901 Long term (current) use of anticoagulants: Secondary | ICD-10-CM | POA: Diagnosis not present

## 2012-06-22 DIAGNOSIS — I4891 Unspecified atrial fibrillation: Secondary | ICD-10-CM | POA: Diagnosis not present

## 2012-07-13 DIAGNOSIS — I4891 Unspecified atrial fibrillation: Secondary | ICD-10-CM | POA: Diagnosis not present

## 2012-07-13 DIAGNOSIS — I10 Essential (primary) hypertension: Secondary | ICD-10-CM | POA: Diagnosis not present

## 2012-08-11 DIAGNOSIS — Z23 Encounter for immunization: Secondary | ICD-10-CM | POA: Diagnosis not present

## 2012-08-11 DIAGNOSIS — Z7901 Long term (current) use of anticoagulants: Secondary | ICD-10-CM | POA: Diagnosis not present

## 2012-08-11 DIAGNOSIS — I4891 Unspecified atrial fibrillation: Secondary | ICD-10-CM | POA: Diagnosis not present

## 2012-09-08 DIAGNOSIS — Z7901 Long term (current) use of anticoagulants: Secondary | ICD-10-CM | POA: Diagnosis not present

## 2012-09-08 DIAGNOSIS — I4891 Unspecified atrial fibrillation: Secondary | ICD-10-CM | POA: Diagnosis not present

## 2012-10-07 DIAGNOSIS — Z7901 Long term (current) use of anticoagulants: Secondary | ICD-10-CM | POA: Diagnosis not present

## 2012-10-07 DIAGNOSIS — I4891 Unspecified atrial fibrillation: Secondary | ICD-10-CM | POA: Diagnosis not present

## 2012-10-20 DIAGNOSIS — I4891 Unspecified atrial fibrillation: Secondary | ICD-10-CM | POA: Diagnosis not present

## 2012-10-20 DIAGNOSIS — Z7901 Long term (current) use of anticoagulants: Secondary | ICD-10-CM | POA: Diagnosis not present

## 2012-11-02 DIAGNOSIS — I4891 Unspecified atrial fibrillation: Secondary | ICD-10-CM | POA: Diagnosis not present

## 2012-11-02 DIAGNOSIS — Z950 Presence of cardiac pacemaker: Secondary | ICD-10-CM | POA: Diagnosis not present

## 2012-11-11 DIAGNOSIS — F411 Generalized anxiety disorder: Secondary | ICD-10-CM | POA: Diagnosis not present

## 2012-11-11 DIAGNOSIS — R1311 Dysphagia, oral phase: Secondary | ICD-10-CM | POA: Diagnosis not present

## 2012-11-11 DIAGNOSIS — G4700 Insomnia, unspecified: Secondary | ICD-10-CM | POA: Diagnosis not present

## 2012-11-11 DIAGNOSIS — I10 Essential (primary) hypertension: Secondary | ICD-10-CM | POA: Diagnosis not present

## 2012-11-11 DIAGNOSIS — N951 Menopausal and female climacteric states: Secondary | ICD-10-CM | POA: Diagnosis not present

## 2012-11-11 DIAGNOSIS — Z79899 Other long term (current) drug therapy: Secondary | ICD-10-CM | POA: Diagnosis not present

## 2012-12-08 DIAGNOSIS — I4891 Unspecified atrial fibrillation: Secondary | ICD-10-CM | POA: Diagnosis not present

## 2012-12-08 DIAGNOSIS — Z7901 Long term (current) use of anticoagulants: Secondary | ICD-10-CM | POA: Diagnosis not present

## 2012-12-28 DIAGNOSIS — I4891 Unspecified atrial fibrillation: Secondary | ICD-10-CM | POA: Diagnosis not present

## 2012-12-28 DIAGNOSIS — Z7901 Long term (current) use of anticoagulants: Secondary | ICD-10-CM | POA: Diagnosis not present

## 2013-01-11 DIAGNOSIS — I10 Essential (primary) hypertension: Secondary | ICD-10-CM | POA: Diagnosis not present

## 2013-01-11 DIAGNOSIS — I4891 Unspecified atrial fibrillation: Secondary | ICD-10-CM | POA: Diagnosis not present

## 2013-02-08 DIAGNOSIS — Z7901 Long term (current) use of anticoagulants: Secondary | ICD-10-CM | POA: Diagnosis not present

## 2013-02-08 DIAGNOSIS — I4891 Unspecified atrial fibrillation: Secondary | ICD-10-CM | POA: Diagnosis not present

## 2013-03-23 DIAGNOSIS — Z7901 Long term (current) use of anticoagulants: Secondary | ICD-10-CM | POA: Diagnosis not present

## 2013-03-23 DIAGNOSIS — I4891 Unspecified atrial fibrillation: Secondary | ICD-10-CM | POA: Diagnosis not present

## 2013-04-05 DIAGNOSIS — R1310 Dysphagia, unspecified: Secondary | ICD-10-CM | POA: Diagnosis not present

## 2013-04-06 ENCOUNTER — Other Ambulatory Visit (HOSPITAL_COMMUNITY): Payer: Self-pay | Admitting: Gastroenterology

## 2013-04-06 ENCOUNTER — Other Ambulatory Visit (HOSPITAL_COMMUNITY): Payer: Self-pay

## 2013-04-06 DIAGNOSIS — R1310 Dysphagia, unspecified: Secondary | ICD-10-CM

## 2013-04-13 ENCOUNTER — Ambulatory Visit (HOSPITAL_COMMUNITY)
Admission: RE | Admit: 2013-04-13 | Discharge: 2013-04-13 | Disposition: A | Discharge status: Home / Self Care | Payer: Medicare Other | Source: Ambulatory Visit | Attending: Gastroenterology | Admitting: Gastroenterology

## 2013-04-13 DIAGNOSIS — R1310 Dysphagia, unspecified: Secondary | ICD-10-CM

## 2013-04-13 NOTE — Procedures (Signed)
Objective Swallowing Evaluation: Modified Barium Swallowing Study  Patient Details  Name: SHEILIA REZNICK MRN: [id number] Date of Birth: [birth year]  Today's Date: 04/13/2013 Time: 1315-1350 SLP Time Calculation (min): 35 min  Past Medical History: No past medical history on file. Past Surgical History: No past surgical history on file. HPI:  79 yo female referred by Dr Evette Cristal for MBS due to concerns for oropharyngeal dysphagia.  Pt PMH + burning mouth syndrome, neuropathy of the mouth diagnosed many years ago, reflux, Afib, anxiety, insomnia, CAD, kidney stones, hiatal hernia, HTN, insomnia.  She states she senses that food gets stuck in her throat requiring her to drink liquids to clear.  Pt also states that it takes her longer to eat than any one else.  She denies weight loss, pulmonary infections, nor ever requriing the heimlich manuever.    Voice noted to be hoarse today which pt states "comes and goes" and has been noted in the last few years - she determined it was a factor of age.      Assessment / Plan / Recommendation Clinical Impression  Dysphagia Diagnosis: Within Functional Limits;Suspected primary esophageal dysphagia;Mild cervical esophageal phase dysphagia Clinical impression: Pt presents with functional oropharyngeal swallow ability with thin, nectar and pudding materials.  SLP did not test cracker nor tablet due to adequacy of swallow function with other consistencies and pending esophagram.  Swallow was timely and strong with only minimal vallecular stasis of nectar liquid.  Please note, pt complained of sensing stasis in pharynx when upon esophageal sweep, it appeared either below UES or at distal region- especially with increased viscocity of material.  Pt also observed to clear her throat before and during MBS  and cough x1 during MBS (no barium visualized in larynx/trachea).   Suspect primary deficits are esophageal in nature- esophagram planned for immediately following this  test.  Thanks for this referral.      Treatment Recommendation    n/a   Diet Recommendation Regular;Thin liquid   Liquid Administration via: Cup Medication Administration: Whole meds with liquid Supervision: Patient able to self feed Compensations: Slow rate;Small sips/bites;Follow solids with liquid Postural Changes and/or Swallow Maneuvers: Seated upright 90 degrees;Out of bed for meals       Follow Up Recommendations  None         General Date of Onset: 04/13/13 HPI: 79 yo female referred by Dr Evette Cristal for MBS due to concerns for oropharyngeal dysphagia.  Pt PMH + burning mouth syndrome, neuropathy of the mouth diagnosed many years ago, reflux, Afib, anxiety, insomnia, CAD, kidney stones, hiatal hernia, HTN, insomnia.  She states she senses that food gets stuck in her throat requiring her to drink liquids to clear.  Pt also states that it takes her longer to eat than any one else.  She denies weight loss, pulmonary infections, nor ever requriing the heimlich manuever.     Type of Study: Modified Barium Swallowing Study Reason for Referral: Objectively evaluate swallowing function Diet Prior to this Study: Regular;Thin liquids Temperature Spikes Noted: No Respiratory Status: Room air History of Recent Intubation: No Behavior/Cognition: Alert;Cooperative Oral Cavity - Dentition: Adequate natural dentition Oral Motor / Sensory Function: Within functional limits Patient Positioning: Upright in chair Baseline Vocal Quality: Clear Volitional Cough: Strong Volitional Swallow: Able to elicit Anatomy: Within functional limits Pharyngeal Secretions: Not observed secondary MBS    Reason for Referral Objectively evaluate swallowing function   Oral Phase Oral Preparation/Oral Phase Oral Phase: WFL   Pharyngeal Phase Pharyngeal Phase Pharyngeal  Phase: Within functional limits  Cervical Esophageal Phase    GO    Cervical Esophageal Phase - Nectar Nectar Cup: Prominent  cricopharyngeal segment Cervical Esophageal Phase - Thin Thin Cup: Prominent cricopharyngeal segment Cervical Esophageal Phase - Solids Puree: Prominent cricopharyngeal segment    Functional Assessment Tool Used: mbs, clinical judgement Functional Limitations: Swallowing Swallow Current Status (Z6109): At least 1 percent but less than 20 percent impaired, limited or restricted Swallow Goal Status [phone]): At least 1 percent but less than 20 percent impaired, limited or restricted Swallow Discharge Status [phone]): At least 1 percent but less than 20 percent impaired, limited or restricted   Donavan Burnet, MS [facility] SLP [phone]

## 2013-05-06 ENCOUNTER — Encounter: Payer: Self-pay | Admitting: Internal Medicine

## 2013-05-06 ENCOUNTER — Ambulatory Visit (INDEPENDENT_AMBULATORY_CARE_PROVIDER_SITE_OTHER): Payer: Medicare Other | Admitting: Pharmacist

## 2013-05-06 ENCOUNTER — Ambulatory Visit (INDEPENDENT_AMBULATORY_CARE_PROVIDER_SITE_OTHER): Payer: Medicare Other | Admitting: *Deleted

## 2013-05-06 DIAGNOSIS — I4891 Unspecified atrial fibrillation: Secondary | ICD-10-CM | POA: Diagnosis not present

## 2013-05-06 LAB — PACEMAKER DEVICE OBSERVATION
BMOD-0003RV: 30
BRDY-0002RV: 60 {beats}/min
BRDY-0004RV: 130 {beats}/min
RV LEAD AMPLITUDE: 11.2 mv
VENTRICULAR PACING PM: 15

## 2013-05-06 LAB — POCT INR: INR: 2

## 2013-05-06 NOTE — Progress Notes (Signed)
Pacemaker check in clinic. Normal device function. Thresholds, sensing, impedances consistent with previous measurements. Device programmed to maximize longevity. 174 high ventricular rates noted.  41.3% of her total heart rates are > 100bpm and Dr. Eldridge Dace is aware.     Device programmed at appropriate safety margins. Histogram distribution appropriate for patient activity level. Device programmed to optimize intrinsic conduction. Estimated longevity 10.5 years. Patient enrolled in remote follow-up/TTM's with [employer]. Plan to follow every 3 months remotely and see annually in office. Patient education completed.  ROV in January with Dr. Ladona Ridgel.

## 2013-05-20 ENCOUNTER — Telehealth: Payer: Self-pay | Admitting: Cardiology

## 2013-05-20 MED ORDER — METOPROLOL TARTRATE 25 MG PO TABS: 25.0000 mg | ORAL_TABLET | Freq: Two times a day (BID) | ORAL | Status: DC

## 2013-05-20 NOTE — Telephone Encounter (Signed)
Pt notified Rx sent in.

## 2013-05-20 NOTE — Telephone Encounter (Signed)
Per Dr. Eldridge Dace he would like for pt to start Metoprolol 25 mg 1 tab po daily for Afib with RVR.

## 2013-05-24 ENCOUNTER — Telehealth: Payer: Self-pay | Admitting: Interventional Cardiology

## 2013-05-24 MED ORDER — METOPROLOL TARTRATE 25 MG PO TABS: ORAL_TABLET | ORAL | Status: DC

## 2013-05-24 NOTE — Telephone Encounter (Signed)
Decrease metoprolol to 12.5 mg BID 

## 2013-05-24 NOTE — Telephone Encounter (Signed)
New message    On new message----feeling lethargic---need advise---Pls call before noon.

## 2013-05-24 NOTE — Telephone Encounter (Signed)
Pt states since she has been taking the metoprolol she hasnt had any energy. Pts HR runs between 65-80bpm. Pt feels the her HR has always been good.

## 2013-05-24 NOTE — Telephone Encounter (Signed)
lmom at pts request and meds updated

## 2013-05-25 ENCOUNTER — Telehealth: Payer: Self-pay | Admitting: Interventional Cardiology

## 2013-05-25 NOTE — Telephone Encounter (Signed)
Called stating she was started on Metoprolol 25 mg BID on Sat 10/18.  States she called yest to speak w/Dr. Eldridge Dace due to medication making her feel lethargic.  Was told to decrease dose to 1/2 of dose BID.  States after talking with office she started having diarrhea.  According to device clinic was started on Metoprolol due to readings from 05/06/13 device check that showed Afib w/RVR.  States she hasn't taken dose this AM.  States at home BP ranging 120-130; and heart rate has been 64-85.  Also states that she does have problems with diarrhea at times.  Spoke w/DOD-Dr. Johney Frame who states that he doesn't feel like Metoprolol is causing diarrhea and to continue taking 1/2 dose BID. Also for her to get in touch with her PCP.  Called pt back and advised pt of reason for her taking the medication and stressed to her the importance of continuing taking it.  She states she does have imodium and will take dose of that.  She will take her medication dose this AM and again tonight.  Will call her PCP-Dr. Clovis Riley if continues to have problems.

## 2013-05-25 NOTE — Telephone Encounter (Signed)
New problem  Patient is taking metoprolo, since beginning this med she's had severe lose stools. Please call patient.

## 2013-05-27 ENCOUNTER — Telehealth: Payer: Self-pay | Admitting: Interventional Cardiology

## 2013-05-27 NOTE — Telephone Encounter (Signed)
Per Dr. Eldridge Dace pt should stop Metoprolol because of side effects. Pt should call if she has problems with increased HR.

## 2013-05-27 NOTE — Telephone Encounter (Signed)
Called pt and she has been off the metoprolol since last night. Pt states she was having diarrhea. Today pt isnt have diarrhea today. Pts BP today 151/80 and HR is about 85 bpm. Pts HR last night was around 90 bpm. Per Dr. Eldridge Dace pt should continue to monitor BP and HR off metoprolol and call if she continues to have problems.

## 2013-05-27 NOTE — Telephone Encounter (Signed)
Follow up   C/o bp and heart rate is high---off metoprolol.  Have not heard from amy

## 2013-05-27 NOTE — Telephone Encounter (Signed)
New Problem:  Pt states she is on a new med and she is having diarrhea terribly. Pt states she is very weak. Pt would like to be advised

## 2013-06-07 ENCOUNTER — Ambulatory Visit (INDEPENDENT_AMBULATORY_CARE_PROVIDER_SITE_OTHER): Payer: Medicare Other | Admitting: Pharmacist

## 2013-06-07 DIAGNOSIS — I4891 Unspecified atrial fibrillation: Secondary | ICD-10-CM | POA: Diagnosis not present

## 2013-06-07 LAB — POCT INR: INR: 1.2

## 2013-06-13 DIAGNOSIS — Z23 Encounter for immunization: Secondary | ICD-10-CM | POA: Diagnosis not present

## 2013-06-21 ENCOUNTER — Ambulatory Visit (INDEPENDENT_AMBULATORY_CARE_PROVIDER_SITE_OTHER): Payer: Medicare Other | Admitting: *Deleted

## 2013-06-21 DIAGNOSIS — I4891 Unspecified atrial fibrillation: Secondary | ICD-10-CM | POA: Diagnosis not present

## 2013-07-01 ENCOUNTER — Other Ambulatory Visit: Payer: Self-pay | Admitting: Interventional Cardiology

## 2013-07-05 ENCOUNTER — Other Ambulatory Visit: Payer: Self-pay | Admitting: Interventional Cardiology

## 2013-07-09 ENCOUNTER — Encounter: Payer: Self-pay | Admitting: Interventional Cardiology

## 2013-07-09 ENCOUNTER — Encounter: Payer: Self-pay | Admitting: *Deleted

## 2013-07-09 DIAGNOSIS — G4700 Insomnia, unspecified: Secondary | ICD-10-CM | POA: Insufficient documentation

## 2013-07-09 DIAGNOSIS — K219 Gastro-esophageal reflux disease without esophagitis: Secondary | ICD-10-CM | POA: Insufficient documentation

## 2013-07-09 DIAGNOSIS — I2510 Atherosclerotic heart disease of native coronary artery without angina pectoris: Secondary | ICD-10-CM | POA: Insufficient documentation

## 2013-07-09 DIAGNOSIS — I10 Essential (primary) hypertension: Secondary | ICD-10-CM | POA: Insufficient documentation

## 2013-07-09 DIAGNOSIS — K146 Glossodynia: Secondary | ICD-10-CM | POA: Insufficient documentation

## 2013-07-09 DIAGNOSIS — F419 Anxiety disorder, unspecified: Secondary | ICD-10-CM | POA: Insufficient documentation

## 2013-07-09 DIAGNOSIS — R1310 Dysphagia, unspecified: Secondary | ICD-10-CM | POA: Insufficient documentation

## 2013-07-11 ENCOUNTER — Encounter: Payer: Self-pay | Admitting: Interventional Cardiology

## 2013-07-11 ENCOUNTER — Ambulatory Visit (INDEPENDENT_AMBULATORY_CARE_PROVIDER_SITE_OTHER): Payer: Medicare Other | Admitting: *Deleted

## 2013-07-11 ENCOUNTER — Ambulatory Visit (INDEPENDENT_AMBULATORY_CARE_PROVIDER_SITE_OTHER): Payer: Medicare Other | Admitting: Interventional Cardiology

## 2013-07-11 VITALS — BP 138/70 | HR 90 | Ht 62.0 in | Wt 130.0 lb

## 2013-07-11 DIAGNOSIS — I10 Essential (primary) hypertension: Secondary | ICD-10-CM | POA: Diagnosis not present

## 2013-07-11 DIAGNOSIS — I4891 Unspecified atrial fibrillation: Secondary | ICD-10-CM | POA: Diagnosis not present

## 2013-07-11 LAB — POCT INR: INR: 2

## 2013-07-11 NOTE — Progress Notes (Signed)
Patient ID: [name], female   DOB: 1918-11-28, 79 y.o.   MRN: [id number]    [address] [place], [place]  [address] Phone: [phone] Fax:  [phone]  Date:  07/11/2013   ID:  [name], DOB [birth year], MRN [id number]  PCP:  [name], MD      History of Present Illness: [name] is a 79 y.o. female who has had AFib and HTN. BP has been controlled.  Her hR was high on the pacer check.  Metoprolol was adede for rate control but she felt worse and had bad diarrhea.  She is getting better now.   Atrial Fibrillation F/U:  Denies : Chest pain.  Dizziness.  Leg edema.  Palpitations.  Shortness of breath.  Syncope.    Wt Readings from Last 3 Encounters:  07/11/13 130 lb (58.968 kg)     Past Medical History  Diagnosis Date  . Atrial fibrillation   . HTN (hypertension)   . Anxiety   . GERD (gastroesophageal reflux disease)   . Insomnia   . Coronary atherosclerosis of native coronary artery   . Dysphagia   . CAD (coronary artery disease)   . Burning mouth syndrome     Current Outpatient Prescriptions  Medication Sig Dispense Refill  . clonazePAM (KLONOPIN) 0.5 MG tablet Take 1 tablet by mouth daily.      Marland Kitchen diltiazem (CARDIZEM CD) 300 MG 24 hr capsule Take 1 capsule by mouth daily.      . furosemide (LASIX) 40 MG tablet Take 1 tablet by mouth daily.      Marland Kitchen KLOR-CON M20 20 MEQ tablet Take 1 tablet by mouth daily.      Marland Kitchen losartan (COZAAR) 100 MG tablet Take 1 tablet by mouth daily.      Marland Kitchen LYRICA 50 MG capsule Take 1 capsule by mouth daily.      . medroxyPROGESTERone (PROVERA) 10 MG tablet Take 1 tablet by mouth daily.      Marland Kitchen PREMARIN 0.625 MG tablet Take 1 tablet by mouth daily.      Marland Kitchen warfarin (COUMADIN) 2.5 MG tablet Take 1 and 1/2 tablets daily, or as directed by Coumadin Clinic.  45 tablet  5   No current facility-administered medications for this visit.    Allergies:    Allergies  Allergen Reactions  . Cortisone      Social History:  The patient  reports that she has never smoked. She does not have any smokeless tobacco history on file. She reports that she does not drink alcohol or use illicit drugs.   Family History:  The patient's family history includes Cancer - Colon in her sister.   ROS:  Please see the history of present illness.  No nausea, vomiting.  No fevers, chills.  No focal weakness.  No dysuria.   Alli other systems reviewed and negative.   PHYSICAL EXAM: VS:  BP 138/70  Pulse 90  Ht 5\' 2"  (1.575 m)  Wt 130 lb (58.968 kg)  BMI 23.77 kg/m2 Well nourished, well developed, in no acute distress HEENT: normal Neck: no JVD, no carotid bruits Cardiac:  normal S1, S2; irregularly irregular Lungs:  clear to auscultation bilaterally, no wheezing, rhonchi or rales Abd: soft, nontender, no hepatomegaly Ext: no edema Skin: warm and dry Neuro:   no focal abnormalities noted  EKG:  AFib, rate controled. Occasional paced beats   ASSESSMENT AND PLAN:  Atrial fibrillation  Continue Diltiazem HCl Coated Beads Tablet  Extended Release 24 Hour, 300 MG, 1 capsule, Orally, Once a day, Notes: switches to 360 mg for tachycardia at times  Notes: Rate controlled.  Did not tolerate metoprolol .   2. Essential hypertension, benign  Continue Losartan Potassium Tablet, 100 MG, 1 tablet, Orally, once a day Notes: Controlled.    Preventive Medicine  Adult topics discussed:  Diet: healthy diet.  Exercise: 5 days a week,, at least 30 minutes of aerobic exercise.      Signed, Fredric Mare, MD, [facility] 07/11/2013 11:46 AM

## 2013-07-11 NOTE — Patient Instructions (Addendum)
Your physician wants you to follow-up in: 6 months with Dr. Varanasi. You will receive a reminder letter in the mail two months in advance. If you don't receive a letter, please call our office to schedule the follow-up appointment.  Your physician recommends that you continue on your current medications as directed. Please refer to the Current Medication list given to you today.  

## 2013-07-22 ENCOUNTER — Other Ambulatory Visit: Payer: Self-pay | Admitting: Interventional Cardiology

## 2013-07-25 NOTE — Telephone Encounter (Signed)
Should this patient take 300mg  or 360mg  of diltiazem daily? Please clarify. Thanks, MI

## 2013-07-25 NOTE — Telephone Encounter (Signed)
Spoke with pt and she is taking Diltiazem 360 mg daily, and 300 mg if BP drops or she feels dizzy. Per Dr. Eldridge Dace this is fine with him and to rf Diltiazem 360 mg.

## 2013-08-01 ENCOUNTER — Other Ambulatory Visit: Payer: Self-pay | Admitting: *Deleted

## 2013-08-01 MED ORDER — FUROSEMIDE 40 MG PO TABS: 40.0000 mg | ORAL_TABLET | Freq: Every day | ORAL | Status: DC

## 2013-08-09 ENCOUNTER — Other Ambulatory Visit: Payer: Self-pay

## 2013-08-09 MED ORDER — POTASSIUM CHLORIDE CRYS ER 20 MEQ PO TBCR: 20.0000 meq | EXTENDED_RELEASE_TABLET | Freq: Every day | ORAL | Status: DC

## 2013-08-23 ENCOUNTER — Ambulatory Visit (INDEPENDENT_AMBULATORY_CARE_PROVIDER_SITE_OTHER): Payer: Medicare Other | Admitting: Pharmacist

## 2013-08-23 ENCOUNTER — Ambulatory Visit (INDEPENDENT_AMBULATORY_CARE_PROVIDER_SITE_OTHER): Payer: Medicare Other | Admitting: Internal Medicine

## 2013-08-23 ENCOUNTER — Encounter: Payer: Self-pay | Admitting: Internal Medicine

## 2013-08-23 VITALS — BP 138/82 | HR 91 | Ht 61.0 in | Wt 131.0 lb

## 2013-08-23 DIAGNOSIS — I4891 Unspecified atrial fibrillation: Secondary | ICD-10-CM

## 2013-08-23 LAB — MDC_IDC_ENUM_SESS_TYPE_INCLINIC
Battery Remaining Longevity: 122 mo
Date Time Interrogation Session: 20150120130719
Lead Channel Impedance Value: 648 Ohm
Lead Channel Pacing Threshold Amplitude: 0.5 V
Lead Channel Pacing Threshold Pulse Width: 0.4 ms
Lead Channel Sensing Intrinsic Amplitude: 15.67 mV
MDC IDC MSMT BATTERY IMPEDANCE: 219 Ohm
MDC IDC MSMT BATTERY VOLTAGE: 2.78 V
MDC IDC MSMT LEADCHNL RA IMPEDANCE VALUE: 67 Ohm
MDC IDC SET LEADCHNL RV PACING AMPLITUDE: 2.5 V
MDC IDC SET LEADCHNL RV PACING PULSEWIDTH: 0.4 ms
MDC IDC SET LEADCHNL RV SENSING SENSITIVITY: 5.6 mV
MDC IDC STAT BRADY RV PERCENT PACED: 18 %

## 2013-08-23 LAB — POCT INR: INR: 1.7

## 2013-08-23 NOTE — Patient Instructions (Signed)

## 2013-08-23 NOTE — Progress Notes (Signed)
Patient ID: [name], female   DOB: 02/24/1919, 80 y.o.   MRN: [id number]    [address] [place], [place]  [address] Phone: [phone] Fax:  [phone]  Date:  08/23/2013   ID:  [name], DOB 02/24/1919, MRN [id number]  PCP:  Lupe CarneyMITCHELL,DEAN, MD      History of Present Illness: [name] is a 80 y.o. female who has had AFib and HTN and symptomatic bradycardia, status post permanent pacemaker insertion. Her BP has been controlled.  She is getting better now.  She denies syncope, chest pain, or shortness of breath. She has done amazingly well despite her advanced age. No peripheral edema or palpitations.   Wt Readings from Last 3 Encounters:  08/23/13 131 lb (59.421 kg)  07/11/13 130 lb (58.968 kg)     Past Medical History  Diagnosis Date  . Atrial fibrillation   . HTN (hypertension)   . Anxiety   . GERD (gastroesophageal reflux disease)   . Insomnia   . Coronary atherosclerosis of native coronary artery   . Dysphagia   . CAD (coronary artery disease)   . Burning mouth syndrome     Current Outpatient Prescriptions  Medication Sig Dispense Refill  . clonazePAM (KLONOPIN) 0.5 MG tablet Take 1 tablet by mouth daily.      Marland Kitchen. diltiazem (CARDIZEM CD) 300 MG 24 hr capsule Take 1 capsule by mouth daily.      . furosemide (LASIX) 40 MG tablet Take 1 tablet (40 mg total) by mouth daily.  30 tablet  5  . losartan (COZAAR) 100 MG tablet Take 1 tablet by mouth daily.      Marland Kitchen. LYRICA 50 MG capsule Take 1 capsule by mouth daily.      . medroxyPROGESTERone (PROVERA) 10 MG tablet Take 1 tablet by mouth daily.      . potassium chloride SA (KLOR-CON M20) 20 MEQ tablet Take 1 tablet (20 mEq total) by mouth daily.  90 tablet  2  . PREMARIN 0.625 MG tablet Take 1 tablet by mouth daily.      . sertraline (ZOLOFT) 100 MG tablet Take 1/4 tab once daily      . warfarin (COUMADIN) 2.5 MG tablet Take 1 and 1/2 tablets daily, or as directed by Coumadin Clinic.  45  tablet  5   No current facility-administered medications for this visit.    Allergies:    Allergies  Allergen Reactions  . Cortisone   . Metoprolol     Social History:  The patient  reports that she has never smoked. She does not have any smokeless tobacco history on file. She reports that she does not drink alcohol or use illicit drugs.   Family History:  The patient's family history includes Cancer - Colon in her sister.   ROS:  Please see the history of present illness.  No nausea, vomiting.  No fevers, chills.  No focal weakness.  No dysuria.   Alli other systems reviewed and negative.   PHYSICAL EXAM: VS:  BP 138/82  Pulse 91  Ht 5\' 1"  (1.549 m)  Wt 131 lb (59.421 kg)  BMI 24.76 kg/m2 Well nourished, well developed, in no acute distress HEENT: normal Neck: no JVD, no carotid bruits Cardiac:  normal S1, S2; irregularly irregular Lungs:  clear to auscultation bilaterally, no wheezing, rhonchi or rales Abd: soft, nontender, no hepatomegaly Ext: no edema Skin: warm and dry Neuro:   no focal abnormalities noted  ASSESSMENT AND PLAN:  Atrial fibrillation  Continue Diltiazem HCl Coated Beads Tablet Extended Release 24 Hour, 300 MG, 1 capsule, Orally, Once a day, Notes: switches to 360 mg for tachycardia at times  Notes: Rate controlled.     2. Essential hypertension, benign  Continue Losartan Potassium Tablet, 100 MG, 1 tablet, Orally, once a day Notes: Controlled.    Preventive Medicine  Adult topics discussed:  Diet: healthy diet.  Exercise: 5 days a week,, at least 30 minutes of aerobic exercise.       Lewayne Bunting, M.D. 08/23/2013 12:30 PM

## 2013-09-06 ENCOUNTER — Ambulatory Visit (INDEPENDENT_AMBULATORY_CARE_PROVIDER_SITE_OTHER): Payer: Medicare Other | Admitting: Pharmacist

## 2013-09-06 DIAGNOSIS — Z5181 Encounter for therapeutic drug level monitoring: Secondary | ICD-10-CM | POA: Diagnosis not present

## 2013-09-06 DIAGNOSIS — I4891 Unspecified atrial fibrillation: Secondary | ICD-10-CM | POA: Diagnosis not present

## 2013-09-06 LAB — POCT INR: INR: 2.3

## 2013-10-13 ENCOUNTER — Ambulatory Visit (INDEPENDENT_AMBULATORY_CARE_PROVIDER_SITE_OTHER): Payer: Medicare Other | Admitting: Pharmacist Clinician (PhC)/ Clinical Pharmacy Specialist

## 2013-10-13 DIAGNOSIS — Z5181 Encounter for therapeutic drug level monitoring: Secondary | ICD-10-CM

## 2013-10-13 DIAGNOSIS — I4891 Unspecified atrial fibrillation: Secondary | ICD-10-CM | POA: Diagnosis not present

## 2013-10-13 LAB — POCT INR: INR: 2.1

## 2013-11-11 ENCOUNTER — Ambulatory Visit (INDEPENDENT_AMBULATORY_CARE_PROVIDER_SITE_OTHER): Payer: Medicare Other | Admitting: Pharmacist

## 2013-11-11 DIAGNOSIS — Z5181 Encounter for therapeutic drug level monitoring: Secondary | ICD-10-CM

## 2013-11-11 DIAGNOSIS — I4891 Unspecified atrial fibrillation: Secondary | ICD-10-CM

## 2013-11-11 LAB — POCT INR: INR: 2.2

## 2013-11-24 ENCOUNTER — Encounter: Payer: Medicare Other | Admitting: *Deleted

## 2013-11-28 ENCOUNTER — Encounter: Payer: Self-pay | Admitting: *Deleted

## 2013-11-30 ENCOUNTER — Ambulatory Visit (INDEPENDENT_AMBULATORY_CARE_PROVIDER_SITE_OTHER): Payer: Medicare Other | Admitting: Pharmacist

## 2013-11-30 DIAGNOSIS — Z5181 Encounter for therapeutic drug level monitoring: Secondary | ICD-10-CM | POA: Diagnosis not present

## 2013-11-30 DIAGNOSIS — I4891 Unspecified atrial fibrillation: Secondary | ICD-10-CM

## 2013-11-30 LAB — POCT INR: INR: 2

## 2013-12-15 ENCOUNTER — Other Ambulatory Visit: Payer: Self-pay

## 2013-12-15 MED ORDER — LOSARTAN POTASSIUM 100 MG PO TABS: 100.0000 mg | ORAL_TABLET | Freq: Every day | ORAL | Status: DC

## 2013-12-20 ENCOUNTER — Telehealth: Payer: Self-pay

## 2013-12-20 NOTE — Telephone Encounter (Signed)
patient request that all her med go to [facility] in [place]

## 2013-12-22 DIAGNOSIS — I10 Essential (primary) hypertension: Secondary | ICD-10-CM | POA: Diagnosis not present

## 2013-12-22 DIAGNOSIS — R1311 Dysphagia, oral phase: Secondary | ICD-10-CM | POA: Diagnosis not present

## 2013-12-22 DIAGNOSIS — Z79899 Other long term (current) drug therapy: Secondary | ICD-10-CM | POA: Diagnosis not present

## 2013-12-22 DIAGNOSIS — F411 Generalized anxiety disorder: Secondary | ICD-10-CM | POA: Diagnosis not present

## 2013-12-22 DIAGNOSIS — G4700 Insomnia, unspecified: Secondary | ICD-10-CM | POA: Diagnosis not present

## 2013-12-22 DIAGNOSIS — N951 Menopausal and female climacteric states: Secondary | ICD-10-CM | POA: Diagnosis not present

## 2013-12-22 DIAGNOSIS — Z23 Encounter for immunization: Secondary | ICD-10-CM | POA: Diagnosis not present

## 2014-01-10 ENCOUNTER — Ambulatory Visit (INDEPENDENT_AMBULATORY_CARE_PROVIDER_SITE_OTHER): Payer: Medicare Other | Admitting: Pharmacist Clinician (PhC)/ Clinical Pharmacy Specialist

## 2014-01-10 ENCOUNTER — Encounter: Payer: Self-pay | Admitting: Interventional Cardiology

## 2014-01-10 ENCOUNTER — Ambulatory Visit (INDEPENDENT_AMBULATORY_CARE_PROVIDER_SITE_OTHER): Payer: Medicare Other | Admitting: Interventional Cardiology

## 2014-01-10 VITALS — BP 134/67 | HR 82 | Ht 61.0 in | Wt 122.0 lb

## 2014-01-10 DIAGNOSIS — Z950 Presence of cardiac pacemaker: Secondary | ICD-10-CM

## 2014-01-10 DIAGNOSIS — I4891 Unspecified atrial fibrillation: Secondary | ICD-10-CM

## 2014-01-10 DIAGNOSIS — I10 Essential (primary) hypertension: Secondary | ICD-10-CM

## 2014-01-10 DIAGNOSIS — Z5181 Encounter for therapeutic drug level monitoring: Secondary | ICD-10-CM

## 2014-01-10 LAB — POCT INR: INR: 1.7

## 2014-01-10 NOTE — Patient Instructions (Signed)
Your physician recommends that you continue on your current medications as directed. Please refer to the Current Medication list given to you today.  Your physician wants you to follow-up in: 6 months with Dr. Varanasi.  You will receive a reminder letter in the mail two months in advance. If you don't receive a letter, please call our office to schedule the follow-up appointment.  

## 2014-01-10 NOTE — Progress Notes (Signed)
Patient ID: [name] Lowe, female   DOB: [birth year], 80 y.o.   MRN: [id number]   [address] [place], [place]  [address] Phone: [phone] Fax:  [phone]  Date:  01/10/2014   ID:  [name] Ligman, DOB [birth year], MRN [id number]  PCP:  Lupe CarneyMITCHELL,DEAN, MD      History of Present Illness: [name] Weier is a 80 y.o. female who has had AFib and HTN. Feels well. BP has been well controlled, around 120/70s. Tries exercise daily, no SHOB or chest pain.  Had a tough time when her husband was sick but is now feeling much better.  Atrial Fibrillation F/U:  Denies : Chest pain.  Dizziness.  Leg edema.  Palpitations.  Shortness of breath.  Syncope.    Wt Readings from Last 3 Encounters:  01/10/14 122 lb (55.339 kg)  08/23/13 131 lb (59.421 kg)  07/11/13 130 lb (58.968 kg)     Past Medical History  Diagnosis Date  . Atrial fibrillation   . HTN (hypertension)   . Anxiety   . GERD (gastroesophageal reflux disease)   . Insomnia   . Coronary atherosclerosis of native coronary artery   . Dysphagia   . CAD (coronary artery disease)   . Burning mouth syndrome     Current Outpatient Prescriptions  Medication Sig Dispense Refill  . clonazePAM (KLONOPIN) 0.5 MG tablet Take 1 tablet by mouth daily.      Marland Kitchen. diltiazem (TIAZAC) 360 MG 24 hr capsule       . furosemide (LASIX) 40 MG tablet Take 1 tablet (40 mg total) by mouth daily.  30 tablet  5  . losartan (COZAAR) 100 MG tablet Take 1 tablet (100 mg total) by mouth daily.  30 tablet  6  . LYRICA 50 MG capsule Take 1 capsule by mouth daily.      . medroxyPROGESTERone (PROVERA) 10 MG tablet Take 1 tablet by mouth daily.      . potassium chloride SA (KLOR-CON M20) 20 MEQ tablet Take 1 tablet (20 mEq total) by mouth daily.  90 tablet  2  . PREMARIN 0.625 MG tablet Take 1 tablet by mouth daily.      . sertraline (ZOLOFT) 100 MG tablet Take 1/4 tab once daily      . warfarin (COUMADIN) 2.5 MG tablet Take 1 and 1/2  tablets daily, or as directed by Coumadin Clinic.  45 tablet  5   No current facility-administered medications for this visit.    Allergies:    Allergies  Allergen Reactions  . Cortisone   . Metoprolol     Social History:  The patient  reports that she has never smoked. She does not have any smokeless tobacco history on file. She reports that she does not drink alcohol or use illicit drugs.   Family History:  The patient's family history includes Cancer - Colon in her sister.   ROS:  Please see the history of present illness.  No nausea, vomiting.  No fevers, chills.  No focal weakness.  No dysuria.   Alli other systems reviewed and negative.   PHYSICAL EXAM: VS:  BP 134/67  Pulse 82  Ht 5\' 1"  (1.549 [name])  Wt 122 lb (55.339 kg)  BMI 23.06 kg/m2 Well nourished, well developed, in no acute distress HEENT: normal Neck: no JVD, no carotid bruits Cardiac:  normal S1, S2; irregularly irregular Lungs:  clear to auscultation bilaterally, no wheezing, rhonchi or rales Abd: soft, nontender,  no hepatomegaly Ext: no edema Skin: warm and dry Neuro:   no focal abnormalities noted  EKG:  AFib, rate controled. Occasional paced beats   ASSESSMENT AND PLAN:  Atrial fibrillation  Continue Diltiazem HCl Coated Beads Tablet Extended Release 24 Hour, 360 MG, 1 capsule,  Notes: Rate controlled.  Did not tolerate metoprolol .   2. Essential hypertension, benign  Continue Losartan Potassium Tablet, 100 MG, 1 tablet, Orally, once a day Notes: Controlled.    Preventive Medicine  Adult topics discussed:  Diet: healthy diet.  Exercise: 5 days a week,, at least 30 minutes of aerobic exercise.      Signed, Fredric Mare, MD, [facility] 01/10/2014 12:22 PM

## 2014-01-17 ENCOUNTER — Encounter: Payer: Self-pay | Admitting: Cardiology

## 2014-01-19 ENCOUNTER — Ambulatory Visit (INDEPENDENT_AMBULATORY_CARE_PROVIDER_SITE_OTHER): Payer: Medicare Other | Admitting: Pharmacist Clinician (PhC)/ Clinical Pharmacy Specialist

## 2014-01-19 DIAGNOSIS — I4891 Unspecified atrial fibrillation: Secondary | ICD-10-CM | POA: Diagnosis not present

## 2014-01-19 DIAGNOSIS — Z5181 Encounter for therapeutic drug level monitoring: Secondary | ICD-10-CM

## 2014-01-19 LAB — POCT INR: INR: 3

## 2014-01-26 ENCOUNTER — Telehealth: Payer: Self-pay | Admitting: Internal Medicine

## 2014-01-26 ENCOUNTER — Ambulatory Visit (INDEPENDENT_AMBULATORY_CARE_PROVIDER_SITE_OTHER): Payer: Medicare Other | Admitting: *Deleted

## 2014-01-26 DIAGNOSIS — I4891 Unspecified atrial fibrillation: Secondary | ICD-10-CM | POA: Diagnosis not present

## 2014-01-26 LAB — MDC_IDC_ENUM_SESS_TYPE_REMOTE
Battery Remaining Longevity: 118 mo
Battery Voltage: 2.79 V
Brady Statistic RV Percent Paced: 21 %
Lead Channel Impedance Value: 67 Ohm
Lead Channel Pacing Threshold Amplitude: 0.375 V
Lead Channel Sensing Intrinsic Amplitude: 22.4 mV
Lead Channel Setting Pacing Amplitude: 2.5 V
Lead Channel Setting Pacing Pulse Width: 0.4 ms
MDC IDC MSMT BATTERY IMPEDANCE: 244 Ohm
MDC IDC MSMT LEADCHNL RV IMPEDANCE VALUE: 631 Ohm
MDC IDC MSMT LEADCHNL RV PACING THRESHOLD PULSEWIDTH: 0.4 ms
MDC IDC SESS DTM: 20150625132048
MDC IDC SET LEADCHNL RV SENSING SENSITIVITY: 5.6 mV

## 2014-01-26 NOTE — Telephone Encounter (Signed)
Informed pt that we did not receive transmission. Verified serial number and that is correct in the carelink site. Gave pt tech support number to call and ask for help with trouble shooting monitor.

## 2014-01-26 NOTE — Telephone Encounter (Signed)
New Message:  Pt's husband is asking for a call back from one of our device techs. He states he and his wife need clarification on sending in a remote transmission. He asked if he could come in and have someone show them what they need to do. I'm sending a message first b/c our device team may be able to instruct them over the phone and save a strip into the office.

## 2014-01-26 NOTE — Telephone Encounter (Signed)
Instructed pt on how to send a remote transmission. Pt verbalized understanding . I will call pt back and inform pt if we received transmission or not.l

## 2014-01-26 NOTE — Progress Notes (Signed)
Remote pacemaker transmission.   

## 2014-01-30 ENCOUNTER — Other Ambulatory Visit: Payer: Self-pay | Admitting: *Deleted

## 2014-01-30 ENCOUNTER — Other Ambulatory Visit: Payer: Self-pay

## 2014-01-30 MED ORDER — WARFARIN SODIUM 2.5 MG PO TABS: ORAL_TABLET | ORAL | Status: DC

## 2014-01-30 MED ORDER — FUROSEMIDE 40 MG PO TABS: 40.0000 mg | ORAL_TABLET | Freq: Every day | ORAL | Status: DC

## 2014-02-08 ENCOUNTER — Encounter: Payer: Self-pay | Admitting: Cardiology

## 2014-02-14 ENCOUNTER — Encounter: Payer: Self-pay | Admitting: Internal Medicine

## 2014-02-16 ENCOUNTER — Ambulatory Visit (INDEPENDENT_AMBULATORY_CARE_PROVIDER_SITE_OTHER): Payer: Medicare Other | Admitting: *Deleted

## 2014-02-16 DIAGNOSIS — I4891 Unspecified atrial fibrillation: Secondary | ICD-10-CM

## 2014-02-16 DIAGNOSIS — Z5181 Encounter for therapeutic drug level monitoring: Secondary | ICD-10-CM

## 2014-02-16 LAB — POCT INR: INR: 1.9

## 2014-03-16 ENCOUNTER — Ambulatory Visit (INDEPENDENT_AMBULATORY_CARE_PROVIDER_SITE_OTHER): Payer: Medicare Other | Admitting: Pharmacist

## 2014-03-16 DIAGNOSIS — I4891 Unspecified atrial fibrillation: Secondary | ICD-10-CM

## 2014-03-16 DIAGNOSIS — Z5181 Encounter for therapeutic drug level monitoring: Secondary | ICD-10-CM

## 2014-03-16 LAB — POCT INR: INR: 1.9

## 2014-04-03 ENCOUNTER — Other Ambulatory Visit: Payer: Self-pay

## 2014-04-03 MED ORDER — DILTIAZEM HCL ER BEADS 360 MG PO CP24: 360.0000 mg | ORAL_CAPSULE | Freq: Every day | ORAL | Status: DC

## 2014-04-13 ENCOUNTER — Telehealth: Payer: Self-pay | Admitting: Interventional Cardiology

## 2014-04-13 ENCOUNTER — Ambulatory Visit (INDEPENDENT_AMBULATORY_CARE_PROVIDER_SITE_OTHER): Payer: Medicare Other

## 2014-04-13 DIAGNOSIS — I4891 Unspecified atrial fibrillation: Secondary | ICD-10-CM | POA: Diagnosis not present

## 2014-04-13 DIAGNOSIS — Z5181 Encounter for therapeutic drug level monitoring: Secondary | ICD-10-CM | POA: Diagnosis not present

## 2014-04-13 LAB — POCT INR: INR: 1.3

## 2014-04-13 NOTE — Telephone Encounter (Signed)
°  Patient stated she had not felt good over the past few days, no energy. She took her BP and its 97/57. Please call and advise.

## 2014-04-13 NOTE — Telephone Encounter (Signed)
Spoke with pt and she took a trip to [place] over the weekend and since she has been back she has felt very fatigued. Pt states her BP earlier this week and it was 120/69 with HR 70's.  Bp today was 95/50 and a few minutes later BP was 99/40's with HR in the 60's (around 2 PM).  Pt took Bp while on the phone and it was 102/57 with HR of 67 bpm. Pt states her mind feels sluggish as well.

## 2014-04-14 NOTE — Telephone Encounter (Signed)
Spoke with pt and Bp is back to normal today with diastolic readings in the 120's. Pt is feeling better today and she will continue to monitor. I spoke with Dr. Eldridge Dace and he is agreeable to plan.

## 2014-04-26 ENCOUNTER — Ambulatory Visit (INDEPENDENT_AMBULATORY_CARE_PROVIDER_SITE_OTHER): Payer: Medicare Other | Admitting: *Deleted

## 2014-04-26 DIAGNOSIS — Z5181 Encounter for therapeutic drug level monitoring: Secondary | ICD-10-CM | POA: Diagnosis not present

## 2014-04-26 DIAGNOSIS — I4891 Unspecified atrial fibrillation: Secondary | ICD-10-CM | POA: Diagnosis not present

## 2014-04-26 LAB — POCT INR: INR: 2.3

## 2014-04-27 ENCOUNTER — Telehealth: Payer: Self-pay | Admitting: Interventional Cardiology

## 2014-04-27 NOTE — Telephone Encounter (Signed)
New message           Pt would like a call back from amy / pt did not disclose any info

## 2014-04-27 NOTE — Telephone Encounter (Signed)
Spoke with pt and her HR went down to 60 bpm and BP was running 110 systolic.Pt also had swelling in her body and she didn't feel well. She stopped Diltiazem 360 and started back on 300 mg. She feels better on the 300 mg. Her BP runs 120-130 systolic with HR in the 70's and swelling is better. Dr. Eldridge Dace, are you okay with pt taking the 300 mg?

## 2014-04-29 NOTE — Telephone Encounter (Signed)
OK with 300 mg diltiazem

## 2014-04-29 NOTE — Telephone Encounter (Signed)
O with 300 mg tab

## 2014-05-01 ENCOUNTER — Ambulatory Visit (INDEPENDENT_AMBULATORY_CARE_PROVIDER_SITE_OTHER): Payer: Medicare Other | Admitting: *Deleted

## 2014-05-01 ENCOUNTER — Encounter: Payer: Self-pay | Admitting: Internal Medicine

## 2014-05-01 ENCOUNTER — Telehealth: Payer: Self-pay | Admitting: Cardiology

## 2014-05-01 DIAGNOSIS — I4891 Unspecified atrial fibrillation: Secondary | ICD-10-CM

## 2014-05-01 MED ORDER — DILTIAZEM HCL ER BEADS 300 MG PO CP24: 300.0000 mg | ORAL_CAPSULE | Freq: Every day | ORAL | Status: DC

## 2014-05-01 NOTE — Telephone Encounter (Signed)
Pt notified. [facility] rx sent in for Diltiazem 300 mg.

## 2014-05-01 NOTE — Telephone Encounter (Signed)
Attempted to confirm appt x2 no answer / line busy.

## 2014-05-04 NOTE — Progress Notes (Signed)
Remote pacemaker transmission.   

## 2014-05-05 LAB — MDC_IDC_ENUM_SESS_TYPE_REMOTE
Battery Impedance: 292 Ohm
Battery Remaining Longevity: 112 mo
Battery Voltage: 2.78 V
Brady Statistic RV Percent Paced: 24 %
Lead Channel Setting Pacing Amplitude: 2.5 V
Lead Channel Setting Pacing Pulse Width: 0.4 ms
Lead Channel Setting Sensing Sensitivity: 5.6 mV
MDC IDC MSMT LEADCHNL RA IMPEDANCE VALUE: 67 Ohm
MDC IDC MSMT LEADCHNL RV IMPEDANCE VALUE: 614 Ohm
MDC IDC MSMT LEADCHNL RV PACING THRESHOLD AMPLITUDE: 0.5 V
MDC IDC MSMT LEADCHNL RV PACING THRESHOLD PULSEWIDTH: 0.4 ms
MDC IDC MSMT LEADCHNL RV SENSING INTR AMPL: 11.2 mV
MDC IDC SESS DTM: 20150930190625

## 2014-05-15 DIAGNOSIS — Z23 Encounter for immunization: Secondary | ICD-10-CM | POA: Diagnosis not present

## 2014-05-17 ENCOUNTER — Other Ambulatory Visit: Payer: Self-pay | Admitting: *Deleted

## 2014-05-17 MED ORDER — POTASSIUM CHLORIDE CRYS ER 20 MEQ PO TBCR: 20.0000 meq | EXTENDED_RELEASE_TABLET | Freq: Every day | ORAL | Status: DC

## 2014-05-18 ENCOUNTER — Ambulatory Visit (INDEPENDENT_AMBULATORY_CARE_PROVIDER_SITE_OTHER): Payer: Medicare Other | Admitting: Pharmacist

## 2014-05-18 DIAGNOSIS — I4891 Unspecified atrial fibrillation: Secondary | ICD-10-CM | POA: Diagnosis not present

## 2014-05-18 DIAGNOSIS — Z5181 Encounter for therapeutic drug level monitoring: Secondary | ICD-10-CM | POA: Diagnosis not present

## 2014-05-18 LAB — POCT INR: INR: 3.2

## 2014-05-19 ENCOUNTER — Encounter: Payer: Self-pay | Admitting: Cardiology

## 2014-06-01 ENCOUNTER — Other Ambulatory Visit: Payer: Self-pay | Admitting: *Deleted

## 2014-06-01 MED ORDER — WARFARIN SODIUM 2.5 MG PO TABS: ORAL_TABLET | ORAL | Status: DC

## 2014-06-02 ENCOUNTER — Ambulatory Visit (INDEPENDENT_AMBULATORY_CARE_PROVIDER_SITE_OTHER): Payer: Medicare Other | Admitting: *Deleted

## 2014-06-02 DIAGNOSIS — I4891 Unspecified atrial fibrillation: Secondary | ICD-10-CM

## 2014-06-02 DIAGNOSIS — Z5181 Encounter for therapeutic drug level monitoring: Secondary | ICD-10-CM

## 2014-06-02 LAB — POCT INR: INR: 1.8

## 2014-06-16 ENCOUNTER — Ambulatory Visit (INDEPENDENT_AMBULATORY_CARE_PROVIDER_SITE_OTHER): Payer: Medicare Other | Admitting: Pharmacist

## 2014-06-16 DIAGNOSIS — Z5181 Encounter for therapeutic drug level monitoring: Secondary | ICD-10-CM | POA: Diagnosis not present

## 2014-06-16 DIAGNOSIS — I4891 Unspecified atrial fibrillation: Secondary | ICD-10-CM | POA: Diagnosis not present

## 2014-06-16 LAB — POCT INR: INR: 3

## 2014-07-04 ENCOUNTER — Other Ambulatory Visit: Payer: Self-pay | Admitting: *Deleted

## 2014-07-04 MED ORDER — LOSARTAN POTASSIUM 100 MG PO TABS
100.0000 mg | ORAL_TABLET | Freq: Every day | ORAL | Status: DC
Start: 2014-07-04 — End: 2014-09-01

## 2014-07-11 ENCOUNTER — Ambulatory Visit (INDEPENDENT_AMBULATORY_CARE_PROVIDER_SITE_OTHER): Payer: Medicare Other

## 2014-07-11 DIAGNOSIS — I4891 Unspecified atrial fibrillation: Secondary | ICD-10-CM | POA: Diagnosis not present

## 2014-07-11 DIAGNOSIS — Z5181 Encounter for therapeutic drug level monitoring: Secondary | ICD-10-CM

## 2014-07-11 LAB — POCT INR: INR: 3.2

## 2014-07-31 ENCOUNTER — Other Ambulatory Visit: Payer: Self-pay

## 2014-07-31 ENCOUNTER — Telehealth: Payer: Self-pay

## 2014-07-31 NOTE — Telephone Encounter (Signed)
Patient states she recently increased her Diltiazem to 360 mg daily, because her BP went up to 150's/90's. She relates it to stress of the holidays. Has taken last pill today. BP today is 134/ 70, pulse of 94. Do you want her to stay on the 360mg ? If so, will need to refill it.

## 2014-08-01 ENCOUNTER — Ambulatory Visit (INDEPENDENT_AMBULATORY_CARE_PROVIDER_SITE_OTHER): Payer: Medicare Other | Admitting: Pharmacist Clinician (PhC)/ Clinical Pharmacy Specialist

## 2014-08-01 DIAGNOSIS — Z5181 Encounter for therapeutic drug level monitoring: Secondary | ICD-10-CM

## 2014-08-01 DIAGNOSIS — I4891 Unspecified atrial fibrillation: Secondary | ICD-10-CM | POA: Diagnosis not present

## 2014-08-01 LAB — POCT INR: INR: 2.3

## 2014-08-03 ENCOUNTER — Other Ambulatory Visit: Payer: Self-pay | Admitting: Interventional Cardiology

## 2014-08-03 MED ORDER — FUROSEMIDE 40 MG PO TABS
40.0000 mg | ORAL_TABLET | Freq: Every day | ORAL | Status: DC
Start: 2014-08-03 — End: 2015-01-26

## 2014-08-03 MED ORDER — DILTIAZEM HCL ER COATED BEADS 360 MG PO TB24: 360.0000 mg | ORAL_TABLET | Freq: Every day | ORAL | Status: DC

## 2014-08-03 NOTE — Telephone Encounter (Signed)
Since Dr. Eldridge DaceVaranasi is out of office this week, will refill Diltiazem 360mg . She states she has been alternating these two doses for years and Dr. Eldridge DaceVaranasi is aware of it.

## 2014-08-14 ENCOUNTER — Ambulatory Visit (INDEPENDENT_AMBULATORY_CARE_PROVIDER_SITE_OTHER): Payer: Medicare Other | Admitting: Interventional Cardiology

## 2014-08-14 ENCOUNTER — Encounter: Payer: Self-pay | Admitting: Interventional Cardiology

## 2014-08-14 ENCOUNTER — Ambulatory Visit (INDEPENDENT_AMBULATORY_CARE_PROVIDER_SITE_OTHER): Payer: Managed Care, Other (non HMO) | Admitting: Pharmacist

## 2014-08-14 VITALS — BP 100/60 | HR 83 | Ht 61.0 in | Wt 128.0 lb

## 2014-08-14 DIAGNOSIS — I4891 Unspecified atrial fibrillation: Secondary | ICD-10-CM

## 2014-08-14 DIAGNOSIS — I10 Essential (primary) hypertension: Secondary | ICD-10-CM | POA: Diagnosis not present

## 2014-08-14 DIAGNOSIS — Z5181 Encounter for therapeutic drug level monitoring: Secondary | ICD-10-CM

## 2014-08-14 DIAGNOSIS — R600 Localized edema: Secondary | ICD-10-CM | POA: Insufficient documentation

## 2014-08-14 LAB — POCT INR: INR: 2.3

## 2014-08-14 NOTE — Progress Notes (Signed)
Patient ID: [name], female   DOB: 02/14/1919, 81 y.o.   MRN: [id number]    Patient ID: Stacy CholCarolyn M [name], female   DOB: 02/14/1919, 81 y.o.   MRN: [id number]   [address] [place], [place]  [address] Phone: [phone](336) [phone] Fax:  [phone]  Date:  08/14/2014   ID:  [name], DOB 02/14/1919, MRN [id number]  PCP:  Lupe CarneyMITCHELL,DEAN, MD      History of Present Illness: Stacy CholCarolyn M Salsberry is a 81 y.o. female who has had AFib and HTN. Feels well. BP has been well controlled, around 120/70s. Tries exercise daily, no SHOB or chest pain.   Atrial Fibrillation F/U:  Denies : Chest pain.  Dizziness. .  Palpitations.  Shortness of breath.  Syncope.  Had sone stress during the holidays.  BP went up.  She increased diltiazem to 360 and swelling increased.  Stress has decreased so she would like to go back to 300 mg daily.    Wt Readings from Last 3 Encounters:  08/14/14 128 lb (58.06 kg)  01/10/14 122 lb (55.339 kg)  08/23/13 131 lb (59.421 kg)     Past Medical History  Diagnosis Date  . Atrial fibrillation   . HTN (hypertension)   . Anxiety   . GERD (gastroesophageal reflux disease)   . Insomnia   . Coronary atherosclerosis of native coronary artery   . Dysphagia   . CAD (coronary artery disease)   . Burning mouth syndrome     Current Outpatient Prescriptions  Medication Sig Dispense Refill  . clonazePAM (KLONOPIN) 0.5 MG tablet Take 1 tablet by mouth daily.    Marland Kitchen. diltiazem (CARDIZEM LA) 360 MG 24 hr tablet Take 1 tablet (360 mg total) by mouth daily. 30 tablet 6  . diltiazem (TIAZAC) 300 MG 24 hr capsule Take 1 capsule (300 mg total) by mouth daily. 30 capsule 6  . furosemide (LASIX) 40 MG tablet Take 1 tablet (40 mg total) by mouth daily. 30 tablet 5  . losartan (COZAAR) 100 MG tablet Take 1 tablet (100 mg total) by mouth daily. 30 tablet 1  . LYRICA 50 MG capsule Take 1 capsule by mouth daily.    . medroxyPROGESTERone (PROVERA) 10 MG tablet Take 1  tablet by mouth daily.    . potassium chloride SA (KLOR-CON M20) 20 MEQ tablet Take 1 tablet (20 mEq total) by mouth daily. 90 tablet 1  . PREMARIN 0.625 MG tablet Take 1 tablet by mouth daily.    . sertraline (ZOLOFT) 100 MG tablet Take 1/4 tab once daily    . warfarin (COUMADIN) 2.5 MG tablet Take as directed by coumadin clinic 50 tablet 3   No current facility-administered medications for this visit.    Allergies:    Allergies  Allergen Reactions  . Cortisone   . Metoprolol     Social History:  The patient  reports that she has never smoked. She does not have any smokeless tobacco history on file. She reports that she does not drink alcohol or use illicit drugs.   Family History:  The patient's family history includes Cancer - Colon in her sister.   ROS:  Please see the history of present illness.  No nausea, vomiting.  No fevers, chills.  No focal weakness.  No dysuria.   Alli other systems reviewed and negative.   PHYSICAL EXAM: VS:  BP 100/60 mmHg  Pulse 83  Ht 5\' 1"  (1.549 m)  Wt 128 lb (58.06 kg)  BMI 24.20 kg/m2 Well nourished, well developed, in no acute distress HEENT: normal Neck: no JVD, no carotid bruits Cardiac:  normal S1, S2; irregularly irregular Lungs:  clear to auscultation bilaterally, no wheezing, rhonchi or rales Abd: soft, nontender, no hepatomegaly Ext: no edema Skin: warm and dry Neuro:   no focal abnormalities noted Psych : normal affect  EKG:  AFib, rate controled. Occasional paced beats   ASSESSMENT AND PLAN:  Atrial fibrillation  Decreasee Diltiazem HCl Coated Beads Tablet Extended Release 24 Hour, 300 MG, 1 capsule,  Notes: Rate controlled.  Did not tolerate metoprolol .   2. Essential hypertension, benign  Continue Losartan Potassium Tablet, 100 MG, 1 tablet, Orally, once a day Notes: Controlled. Borderline low now that stressful time is over.    3. Edema: Worse with increased diltiazem.  SHould improve with less diltiazem.    Preventive Medicine  Adult topics discussed:  Diet: healthy diet.  Exercise: 5 days a week,, at least 30 minutes of aerobic exercise.      Signed, Fredric Mare, MD, [facility] 08/14/2014 3:19 PM

## 2014-08-14 NOTE — Patient Instructions (Signed)
Your physician has recommended you make the following change in your medication:  1) STOP taking Diltiazem 360mg  2) START taking Diltiazem 300mg  daily   Your physician wants you to follow-up in: 6 months with Dr. Eldridge DaceVaranasi. You will receive a reminder letter in the mail two months in advance. If you don't receive a letter, please call our office to schedule the follow-up appointment.

## 2014-08-31 ENCOUNTER — Ambulatory Visit (INDEPENDENT_AMBULATORY_CARE_PROVIDER_SITE_OTHER): Payer: Medicare Other | Admitting: Internal Medicine

## 2014-08-31 ENCOUNTER — Encounter: Payer: Self-pay | Admitting: Internal Medicine

## 2014-08-31 VITALS — BP 94/50 | HR 82 | Ht 61.5 in | Wt 130.8 lb

## 2014-08-31 DIAGNOSIS — R001 Bradycardia, unspecified: Secondary | ICD-10-CM | POA: Diagnosis not present

## 2014-08-31 DIAGNOSIS — Z950 Presence of cardiac pacemaker: Secondary | ICD-10-CM

## 2014-08-31 DIAGNOSIS — I482 Chronic atrial fibrillation, unspecified: Secondary | ICD-10-CM

## 2014-08-31 DIAGNOSIS — Z45018 Encounter for adjustment and management of other part of cardiac pacemaker: Secondary | ICD-10-CM | POA: Diagnosis not present

## 2014-08-31 LAB — MDC_IDC_ENUM_SESS_TYPE_INCLINIC
Battery Impedance: 316 Ohm
Brady Statistic RV Percent Paced: 23 %
Date Time Interrogation Session: 20160128121943
Lead Channel Impedance Value: 534 Ohm
Lead Channel Impedance Value: 67 Ohm
Lead Channel Setting Pacing Amplitude: 2.5 V
Lead Channel Setting Sensing Sensitivity: 5.6 mV
MDC IDC MSMT BATTERY REMAINING LONGEVITY: 109 mo
MDC IDC MSMT BATTERY VOLTAGE: 2.79 V
MDC IDC MSMT LEADCHNL RV PACING THRESHOLD AMPLITUDE: 0.5 V
MDC IDC MSMT LEADCHNL RV PACING THRESHOLD PULSEWIDTH: 0.4 ms
MDC IDC MSMT LEADCHNL RV SENSING INTR AMPL: 15.67 mV
MDC IDC SET LEADCHNL RV PACING PULSEWIDTH: 0.4 ms

## 2014-08-31 NOTE — Patient Instructions (Signed)
Your physician recommends that you continue on your current medications as directed. Please refer to the Current Medication list given to you today.  Remote monitoring is used to monitor your Pacemaker of ICD from home. This monitoring reduces the number of office visits required to check your device to one time per year. It allows us to keep an eye on the functioning of your device to ensure it is working properly. You are scheduled for a device check from home on 11/30/14. You may send your transmission at any time that day. If you have a wireless device, the transmission will be sent automatically. After your physician reviews your transmission, you will receive a postcard with your next transmission date.  Your physician wants you to follow-up in: 1 year with Dr. Taylor.  You will receive a reminder letter in the mail two months in advance. If you don't receive a letter, please call our office to schedule the follow-up appointment.  

## 2014-08-31 NOTE — Progress Notes (Signed)
Patient ID: Stacy CholCarolyn M [name], female   DOB: 1919-01-09, 81 y.o.   MRN: [id number]    [address] [place], [place]  [address] Phone: [phone] Fax:  [phone]  Date:  08/31/2014   ID:  [name], DOB 1919-01-09, MRN [id number]  PCP:  Lupe CarneyMITCHELL,DEAN, MD      History of Present Illness: [name] is a 81 y.o. female who has had AFib and HTN and symptomatic bradycardia, status post permanent pacemaker insertion. Her BP has been controlled.  She denies syncope, chest pain, or shortness of breath. She has done amazingly well despite her advanced age. No peripheral edema or palpitations.   Wt Readings from Last 3 Encounters:  08/31/14 130 lb 12.8 oz (59.33 kg)  08/14/14 128 lb (58.06 kg)  01/10/14 122 lb (55.339 kg)     Past Medical History  Diagnosis Date  . Atrial fibrillation   . HTN (hypertension)   . Anxiety   . GERD (gastroesophageal reflux disease)   . Insomnia   . Coronary atherosclerosis of native coronary artery   . Dysphagia   . CAD (coronary artery disease)   . Burning mouth syndrome     Current Outpatient Prescriptions  Medication Sig Dispense Refill  . clonazePAM (KLONOPIN) 0.5 MG tablet Take 1 tablet by mouth daily.    Marland Kitchen. diltiazem (TIAZAC) 300 MG 24 hr capsule Take 1 capsule (300 mg total) by mouth daily. 30 capsule 6  . furosemide (LASIX) 40 MG tablet Take 1 tablet (40 mg total) by mouth daily. 30 tablet 5  . losartan (COZAAR) 100 MG tablet Take 1 tablet (100 mg total) by mouth daily. 30 tablet 1  . LYRICA 50 MG capsule Take 1 capsule by mouth daily.    . medroxyPROGESTERone (PROVERA) 10 MG tablet Take 1 tablet by mouth daily.    . potassium chloride SA (KLOR-CON M20) 20 MEQ tablet Take 1 tablet (20 mEq total) by mouth daily. 90 tablet 1  . PREMARIN 0.625 MG tablet Take 1 tablet by mouth daily.    . sertraline (ZOLOFT) 100 MG tablet Take 1/4 tab once daily    . warfarin (COUMADIN) 2.5 MG tablet Take as directed by coumadin clinic  50 tablet 3   No current facility-administered medications for this visit.    Allergies:    Allergies  Allergen Reactions  . Cortisone   . Metoprolol     Social History:  The patient  reports that she has never smoked. She does not have any smokeless tobacco history on file. She reports that she does not drink alcohol or use illicit drugs.   Family History:  The patient's family history includes Cancer - Colon in her sister.   ROS:  Please see the history of present illness.  No nausea, vomiting.  No fevers, chills.  No focal weakness.  No dysuria.   Alli other systems reviewed and negative.   PHYSICAL EXAM: VS:  BP 94/50 mmHg  Pulse 82  Ht 5' 1.5" (1.562 m)  Wt 130 lb 12.8 oz (59.33 kg)  BMI 24.32 kg/m2 Well nourished, well developed, in no acute distress HEENT: normal Neck: no JVD, no carotid bruits Cardiac:  normal S1, S2; irregularly irregular Lungs:  clear to auscultation bilaterally, no wheezing, rhonchi or rales Abd: soft, nontender, no hepatomegaly Ext: no edema Skin: warm and dry Neuro:   no focal abnormalities noted   ASSESSMENT AND PLAN:  Atrial fibrillation  Continue Diltiazem HCl Coated Beads Tablet Extended  Release 24 Hour, 300 MG, 1 capsule, Orally, Once a day, Notes: switches to 360 mg for tachycardia at times  Notes: Rate controlled.     2. Essential hypertension, benign  Continue Losartan Potassium Tablet, 100 MG, 1 tablet, Orally, once a day Notes: Controlled.    Preventive Medicine  Adult topics discussed:  Diet: healthy diet.  Exercise: 5 days a week,, at least 30 minutes of aerobic exercise.       Lewayne Bunting, M.D. 08/31/2014 12:13 PM

## 2014-09-01 ENCOUNTER — Telehealth: Payer: Self-pay | Admitting: Interventional Cardiology

## 2014-09-01 MED ORDER — LOSARTAN POTASSIUM 100 MG PO TABS: 100.0000 mg | ORAL_TABLET | Freq: Every day | ORAL | Status: DC

## 2014-09-01 NOTE — Telephone Encounter (Signed)
New Msg         [facility] calling to get a new 90day script for pt Losartin. Pt is almost out of meds.    Please contact pharmacy at [phone].

## 2014-09-01 NOTE — Telephone Encounter (Signed)
Refill for losartan 100 mg once daily # 90 w/ [facility]

## 2014-09-06 DIAGNOSIS — T1511XA Foreign body in conjunctival sac, right eye, initial encounter: Secondary | ICD-10-CM | POA: Diagnosis not present

## 2014-09-06 DIAGNOSIS — H2513 Age-related nuclear cataract, bilateral: Secondary | ICD-10-CM | POA: Diagnosis not present

## 2014-09-08 ENCOUNTER — Encounter: Payer: Self-pay | Admitting: Internal Medicine

## 2014-09-11 ENCOUNTER — Ambulatory Visit (INDEPENDENT_AMBULATORY_CARE_PROVIDER_SITE_OTHER): Payer: Medicare Other | Admitting: Pharmacist

## 2014-09-11 DIAGNOSIS — I482 Chronic atrial fibrillation, unspecified: Secondary | ICD-10-CM

## 2014-09-11 DIAGNOSIS — Z5181 Encounter for therapeutic drug level monitoring: Secondary | ICD-10-CM | POA: Diagnosis not present

## 2014-09-11 DIAGNOSIS — I4891 Unspecified atrial fibrillation: Secondary | ICD-10-CM

## 2014-09-11 LAB — POCT INR: INR: 1.8

## 2014-10-06 ENCOUNTER — Other Ambulatory Visit: Payer: Self-pay | Admitting: Interventional Cardiology

## 2014-10-11 ENCOUNTER — Ambulatory Visit (INDEPENDENT_AMBULATORY_CARE_PROVIDER_SITE_OTHER): Payer: Medicare Other | Admitting: *Deleted

## 2014-10-11 DIAGNOSIS — I4891 Unspecified atrial fibrillation: Secondary | ICD-10-CM | POA: Diagnosis not present

## 2014-10-11 DIAGNOSIS — I482 Chronic atrial fibrillation, unspecified: Secondary | ICD-10-CM

## 2014-10-11 DIAGNOSIS — Z5181 Encounter for therapeutic drug level monitoring: Secondary | ICD-10-CM | POA: Diagnosis not present

## 2014-10-11 LAB — POCT INR: INR: 1.9

## 2014-11-13 ENCOUNTER — Other Ambulatory Visit: Payer: Self-pay | Admitting: Interventional Cardiology

## 2014-11-29 ENCOUNTER — Ambulatory Visit (INDEPENDENT_AMBULATORY_CARE_PROVIDER_SITE_OTHER): Payer: Medicare Other | Admitting: *Deleted

## 2014-11-29 DIAGNOSIS — I482 Chronic atrial fibrillation, unspecified: Secondary | ICD-10-CM

## 2014-11-29 DIAGNOSIS — Z5181 Encounter for therapeutic drug level monitoring: Secondary | ICD-10-CM

## 2014-11-29 DIAGNOSIS — I4891 Unspecified atrial fibrillation: Secondary | ICD-10-CM | POA: Diagnosis not present

## 2014-11-29 LAB — POCT INR: INR: 1.2

## 2014-11-30 ENCOUNTER — Ambulatory Visit (INDEPENDENT_AMBULATORY_CARE_PROVIDER_SITE_OTHER): Payer: Medicare Other | Admitting: *Deleted

## 2014-11-30 DIAGNOSIS — I4891 Unspecified atrial fibrillation: Secondary | ICD-10-CM

## 2014-11-30 NOTE — Progress Notes (Signed)
Remote pacemaker transmission.   

## 2014-12-06 DIAGNOSIS — M79605 Pain in left leg: Secondary | ICD-10-CM | POA: Diagnosis not present

## 2014-12-07 ENCOUNTER — Ambulatory Visit (INDEPENDENT_AMBULATORY_CARE_PROVIDER_SITE_OTHER): Payer: Medicare Other | Admitting: *Deleted

## 2014-12-07 DIAGNOSIS — I4891 Unspecified atrial fibrillation: Secondary | ICD-10-CM | POA: Diagnosis not present

## 2014-12-07 DIAGNOSIS — Z5181 Encounter for therapeutic drug level monitoring: Secondary | ICD-10-CM | POA: Diagnosis not present

## 2014-12-07 LAB — POCT INR: INR: 2

## 2014-12-08 LAB — CUP PACEART REMOTE DEVICE CHECK
Battery Remaining Longevity: 105 mo
Battery Voltage: 2.78 V
Brady Statistic RV Percent Paced: 18 %
Lead Channel Impedance Value: 478 Ohm
Lead Channel Pacing Threshold Pulse Width: 0.4 ms
Lead Channel Sensing Intrinsic Amplitude: 8 mV
Lead Channel Setting Pacing Pulse Width: 0.4 ms
MDC IDC MSMT BATTERY IMPEDANCE: 365 Ohm
MDC IDC MSMT LEADCHNL RA IMPEDANCE VALUE: 67 Ohm
MDC IDC MSMT LEADCHNL RV PACING THRESHOLD AMPLITUDE: 0.5 V
MDC IDC SESS DTM: 20160428131430
MDC IDC SET LEADCHNL RV PACING AMPLITUDE: 2.5 V
MDC IDC SET LEADCHNL RV SENSING SENSITIVITY: 4 mV

## 2014-12-14 ENCOUNTER — Encounter: Payer: Self-pay | Admitting: Cardiology

## 2014-12-22 ENCOUNTER — Encounter: Payer: Self-pay | Admitting: Internal Medicine

## 2014-12-27 DIAGNOSIS — Z780 Asymptomatic menopausal state: Secondary | ICD-10-CM | POA: Diagnosis not present

## 2014-12-27 DIAGNOSIS — I4891 Unspecified atrial fibrillation: Secondary | ICD-10-CM | POA: Diagnosis not present

## 2014-12-27 DIAGNOSIS — I10 Essential (primary) hypertension: Secondary | ICD-10-CM | POA: Diagnosis not present

## 2014-12-27 DIAGNOSIS — K146 Glossodynia: Secondary | ICD-10-CM | POA: Diagnosis not present

## 2014-12-27 DIAGNOSIS — F419 Anxiety disorder, unspecified: Secondary | ICD-10-CM | POA: Diagnosis not present

## 2015-01-05 ENCOUNTER — Ambulatory Visit (INDEPENDENT_AMBULATORY_CARE_PROVIDER_SITE_OTHER): Payer: Medicare Other | Admitting: *Deleted

## 2015-01-05 DIAGNOSIS — I4891 Unspecified atrial fibrillation: Secondary | ICD-10-CM

## 2015-01-05 DIAGNOSIS — Z5181 Encounter for therapeutic drug level monitoring: Secondary | ICD-10-CM

## 2015-01-05 LAB — POCT INR: INR: 1.7

## 2015-01-12 ENCOUNTER — Other Ambulatory Visit: Payer: Self-pay | Admitting: Interventional Cardiology

## 2015-01-19 ENCOUNTER — Ambulatory Visit (INDEPENDENT_AMBULATORY_CARE_PROVIDER_SITE_OTHER): Payer: Medicare Other

## 2015-01-19 DIAGNOSIS — I4891 Unspecified atrial fibrillation: Secondary | ICD-10-CM | POA: Diagnosis not present

## 2015-01-19 DIAGNOSIS — Z5181 Encounter for therapeutic drug level monitoring: Secondary | ICD-10-CM | POA: Diagnosis not present

## 2015-01-19 LAB — POCT INR: INR: 2.2

## 2015-01-26 ENCOUNTER — Other Ambulatory Visit: Payer: Self-pay | Admitting: Interventional Cardiology

## 2015-01-29 ENCOUNTER — Ambulatory Visit (INDEPENDENT_AMBULATORY_CARE_PROVIDER_SITE_OTHER): Payer: Medicare Other | Admitting: Interventional Cardiology

## 2015-01-29 ENCOUNTER — Encounter: Payer: Self-pay | Admitting: Interventional Cardiology

## 2015-01-29 VITALS — BP 118/62 | HR 87 | Ht 61.0 in | Wt 129.6 lb

## 2015-01-29 DIAGNOSIS — Z5181 Encounter for therapeutic drug level monitoring: Secondary | ICD-10-CM

## 2015-01-29 DIAGNOSIS — R600 Localized edema: Secondary | ICD-10-CM

## 2015-01-29 DIAGNOSIS — I4891 Unspecified atrial fibrillation: Secondary | ICD-10-CM

## 2015-01-29 DIAGNOSIS — I10 Essential (primary) hypertension: Secondary | ICD-10-CM | POA: Diagnosis not present

## 2015-01-29 NOTE — Patient Instructions (Signed)
Medication Instructions:  Same-No change  Labwork: None  Testing/Procedures: None  Follow-Up: Your physician wants you to follow-up in: 6 months. You will receive a reminder letter in the mail two months in advance. If you don't receive a letter, please call our office to schedule the follow-up appointment.      

## 2015-01-29 NOTE — Progress Notes (Signed)
Patient ID: [name], female   DOB: 11/02/1918, 81 y.o.   MRN: [id number]     Cardiology Office Note   Date:  01/29/2015   ID:  [name], DOB Jun 12, 1919, MRN [id number]  PCP:  [name], MD    No chief complaint on file.  atrial fibrillation   Wt Readings from Last 3 Encounters:  01/29/15 129 lb 9.6 oz (58.786 kg)  08/31/14 130 lb 12.8 oz (59.33 kg)  08/14/14 128 lb (58.06 kg)       History of Present Illness: [name] is a 81 y.o. female  who has had AFib and HTN. Feels well. BP has been well controlled, around 120/70s. Tries exercise daily, no SHOB or chest pain.  Atrial Fibrillation F/U:  Denies : Chest pain.  Dizziness. .  Palpitations.  Shortness of breath.  Syncope.  Several months ago, she was more stressed and her blood pressure was up. She took 360 mg of diltiazem daily. This did control her blood pressure better, but she had more swelling. She decrease back to the 300 mg daily. Her blood pressures have been stable. She feels more fatigue. It takes her longer to do the same activities. Her husband reports that she easily gets upset and stressed over what he considers small issues.    Past Medical History  Diagnosis Date  . Atrial fibrillation   . HTN (hypertension)   . Anxiety   . GERD (gastroesophageal reflux disease)   . Insomnia   . Coronary atherosclerosis of native coronary artery   . Dysphagia   . CAD (coronary artery disease)   . Burning mouth syndrome     Past Surgical History  Procedure Laterality Date  . Tonsillectomy    . Breast lumpectomy    . Kidney stone surgery       Current Outpatient Prescriptions  Medication Sig Dispense Refill  . clonazePAM (KLONOPIN) 0.5 MG tablet Take 1 tablet by mouth daily.    . furosemide (LASIX) 40 MG tablet TAKE ONE TABLET BY MOUTH ONCE DAILY 30 tablet 6  . KLOR-CON M20 20 MEQ tablet TAKE ONE TABLET BY MOUTH ONCE DAILY 90 tablet 0  . losartan (COZAAR) 100 MG tablet Take 1  tablet (100 mg total) by mouth daily. 90 tablet 3  . LYRICA 50 MG capsule Take 2 capsules by mouth 2 (two) times daily.     . medroxyPROGESTERone (PROVERA) 10 MG tablet Take 1 tablet by mouth daily.    Marland Kitchen PREMARIN 0.625 MG tablet Take 1 tablet by mouth daily.    . sertraline (ZOLOFT) 100 MG tablet Take 1/4 tab by mouth once daily    . TAZTIA XT 300 MG 24 hr capsule TAKE ONE CAPSULE BY MOUTH ONCE DAILY 30 capsule 6  . warfarin (COUMADIN) 2.5 MG tablet TAKE AS DIRECTED BY COUMADIN CLINIC 50 tablet 3   No current facility-administered medications for this visit.    Allergies:   Metoprolol and Cortisone    Social History:  The patient  reports that she has never smoked. She has never used smokeless tobacco. She reports that she does not drink alcohol or use illicit drugs.   Family History:  The patient's family history includes Bladder Cancer in her brother, father, and paternal grandfather; Leukemia in her mother; Liver cancer in her sister.    ROS:  Please see the history of present illness.   Otherwise, review of systems are positive for .   All other systems are reviewed and  negative.    PHYSICAL EXAM: VS:  BP 118/62 mmHg  Pulse 87  Ht 5\' 1"  (1.549 m)  Wt 129 lb 9.6 oz (58.786 kg)  BMI 24.50 kg/m2  SpO2 93% , BMI Body mass index is 24.5 kg/(m^2). GEN: Well nourished, well developed, in no acute distress HEENT: normal Neck: no JVD, carotid bruits, or masses Cardiac: Irregularly irregular; no murmurs, rubs, or gallops,no edema  Respiratory:  clear to auscultation bilaterally, normal work of breathing GI: soft, nontender, nondistended, + BS MS: no deformity or atrophy Skin: warm and dry, no rash, shins are discolored-chronic Neuro:  Strength and sensation are intact Psych: euthymic mood, full affect    Recent Labs: No results found for requested labs within last 365 days.   Lipid Panel No results found for: CHOL, TRIG, HDL, CHOLHDL, VLDL, LDLCALC, LDLDIRECT   Other studies  Reviewed: Additional studies/ records that were reviewed today with results demonstrating: .   ASSESSMENT AND PLAN:  1. Atrial fibrillation: Rate controlled. Continue current medicines. She did not tolerate metoprolol in the past. We just adjust the diltiazem dose as needed.  Coumadin for stroke prevention. No bleeding issues. She has not had any falls recently. Continue anticoagulation. 2. Hypertension: Blood pressure well controlled. Continue losartan. 3. Edema: Improved on the lower dose of diltiazem.  Elevate legs when possible. 4. Fatigue: I think this is likely age-related. No obvious fluid overload.  She does appear to have  slowed down a fair bit from just a couple of years ago. 5. Skin discoloration: This started when she took amiodarone. There is likely a component of venous insufficiency as well. Would not plan any further management of this issue.   Current medicines are reviewed at length with the patient today.  The patient concerns regarding her medicines were addressed.  The following changes have been made:  No change  Labs/ tests ordered today include:  No orders of the defined types were placed in this encounter.    Recommend 150 minutes/week of aerobic exercise Low fat, low carb, high fiber diet recommended  Disposition:   FU in 8 months   Delorise JacksonSigned, Judithann Villamar S., MD  01/29/2015 10:55 AM    [facility] [address], [place], [place]  [address] Phone: [phone]; Fax: [phone]

## 2015-02-08 ENCOUNTER — Ambulatory Visit (INDEPENDENT_AMBULATORY_CARE_PROVIDER_SITE_OTHER): Payer: Medicare Other | Admitting: *Deleted

## 2015-02-08 DIAGNOSIS — I4891 Unspecified atrial fibrillation: Secondary | ICD-10-CM | POA: Diagnosis not present

## 2015-02-08 DIAGNOSIS — Z5181 Encounter for therapeutic drug level monitoring: Secondary | ICD-10-CM | POA: Diagnosis not present

## 2015-02-08 LAB — POCT INR: INR: 2

## 2015-02-09 ENCOUNTER — Other Ambulatory Visit: Payer: Self-pay | Admitting: *Deleted

## 2015-02-09 ENCOUNTER — Other Ambulatory Visit: Payer: Self-pay | Admitting: Interventional Cardiology

## 2015-02-09 MED ORDER — POTASSIUM CHLORIDE CRYS ER 20 MEQ PO TBCR: 20.0000 meq | EXTENDED_RELEASE_TABLET | Freq: Every day | ORAL | Status: DC

## 2015-02-11 IMAGING — RF DG ESOPHAGUS
14 of 24 series · 14 of 24 positions shown · non-contrast
Comparison: Chest x-ray 05/21/2012

CLINICAL DATA: Dysphagia.  The patient feels like food gets stuck
sometimes.

ESOPHAGUS/BARIUM SWALLOW/TABLET STUDY
Fluoroscopy Time: 1 minute, 19 seconds

[Series 1: run · 1 of 1 slices shown (1 of 14)]
[im 1/1]
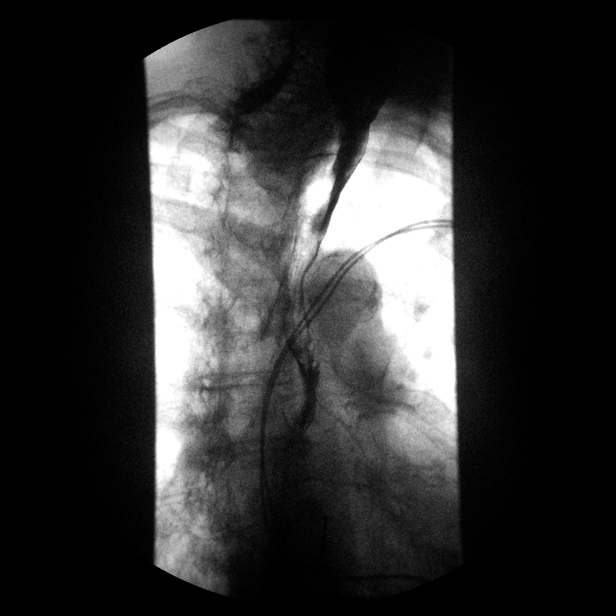

[Series 2: run · 1 of 1 slices shown (2 of 14)]
[im 1/1]
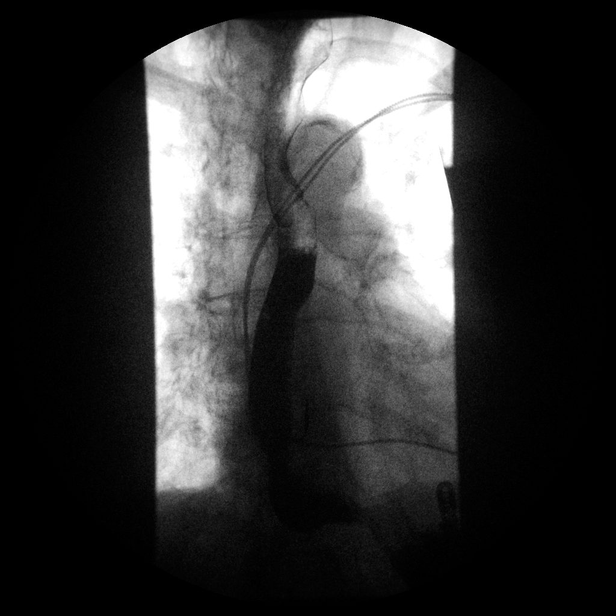

[Series 3: run · 1 of 1 slices shown (3 of 14)]
[im 1/1]
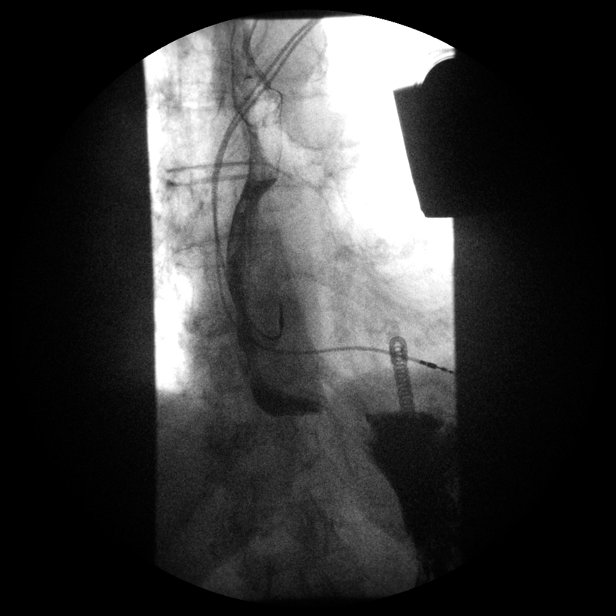

[Series 4: run · 1 of 1 slices shown (4 of 14)]
[im 1/1]
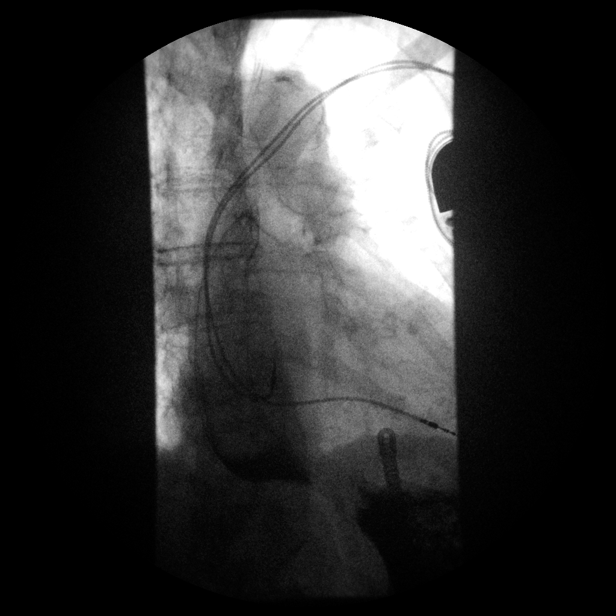

[Series 4: run · 1 of 1 slices shown (5 of 14)]
[im 1/1]
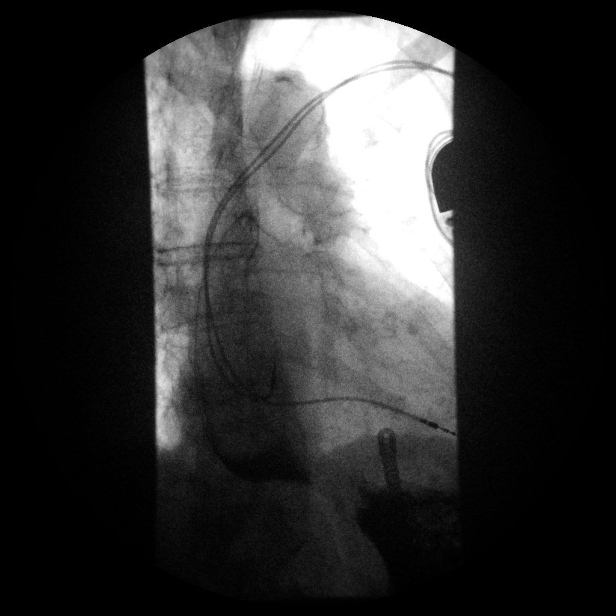

[Series 5: run · 1 of 1 slices shown (6 of 14)]
[im 1/1]
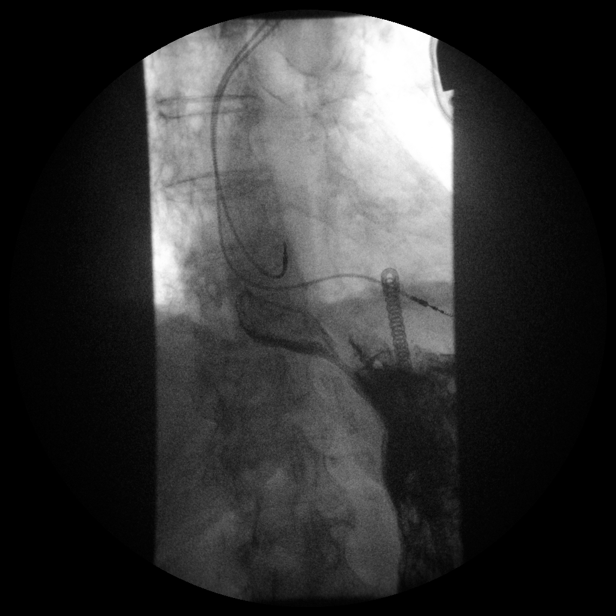

[Series 6: run · 1 of 1 slices shown (7 of 14)]
[im 1/1]
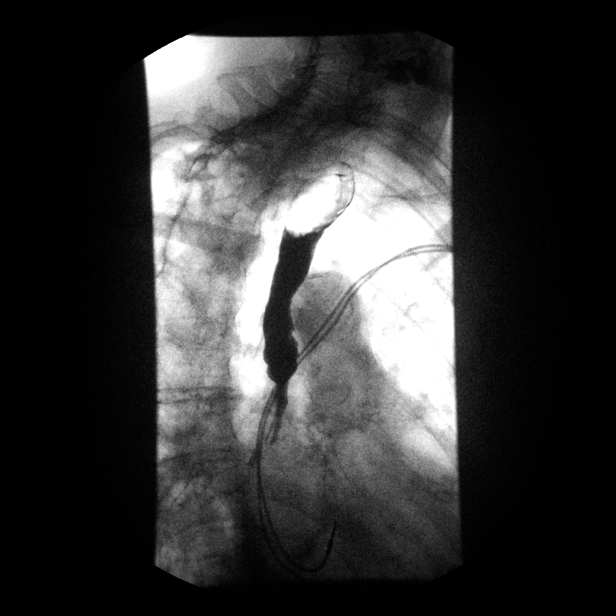

[Series 7: run · 1 of 1 slices shown (8 of 14)]
[im 1/1]
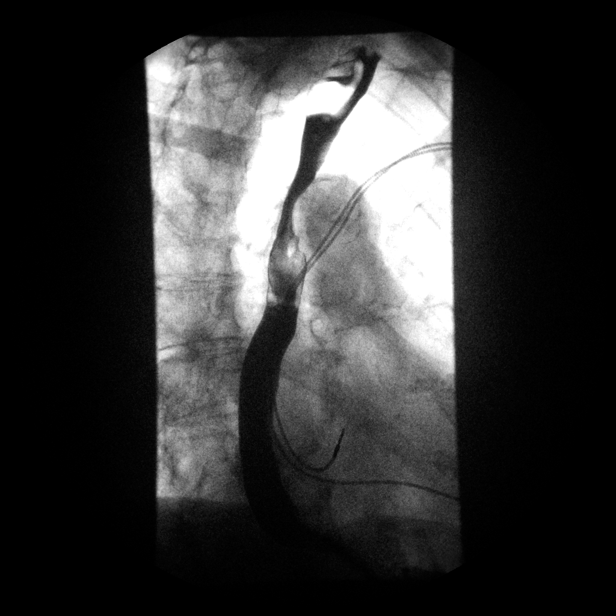

[Series 8: run · 1 of 1 slices shown (9 of 14)]
[im 1/1]
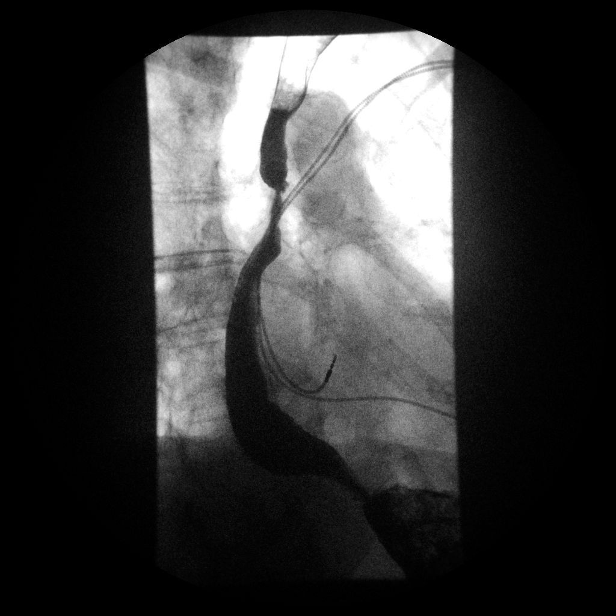

[Series 9: run · 1 of 1 slices shown (10 of 14)]
[im 1/1]
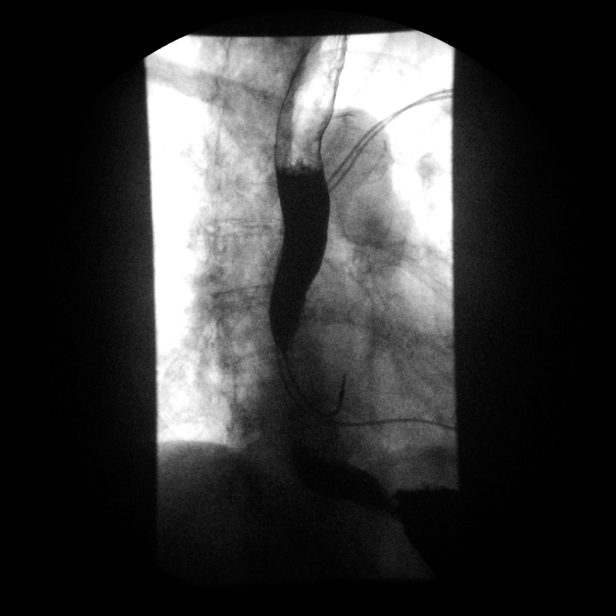

[Series 10: run · 1 of 1 slices shown (11 of 14)]
[im 1/1]
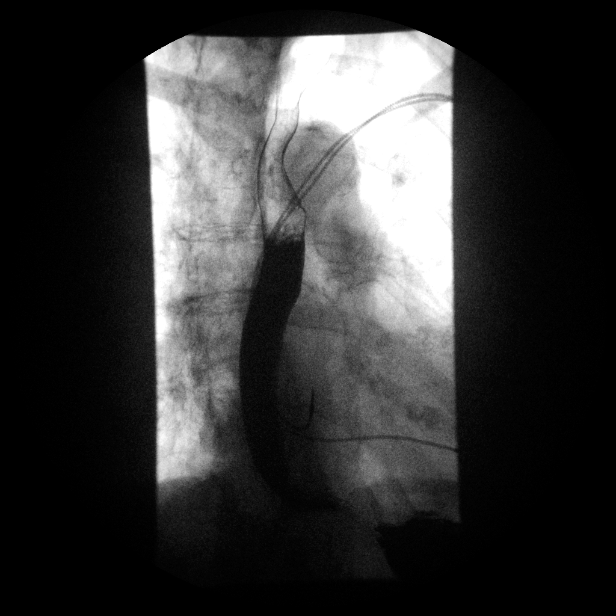

[Series 10: run · 1 of 1 slices shown (12 of 14)]
[im 1/1]
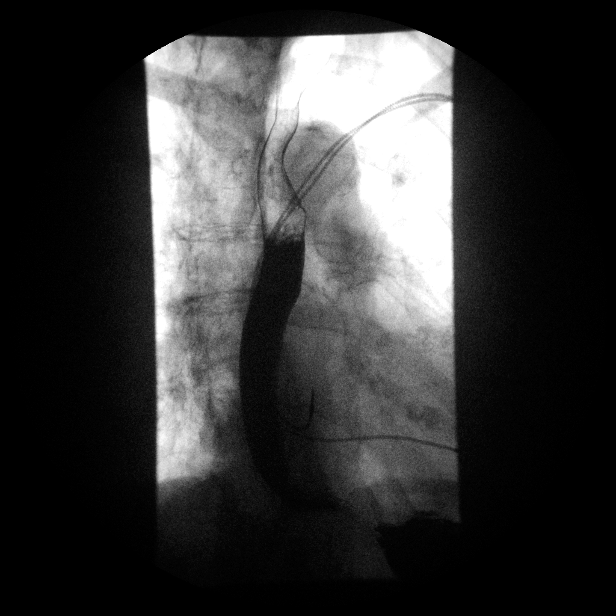

[Series 11: run · 1 of 1 slices shown (13 of 14)]
[im 1/1]
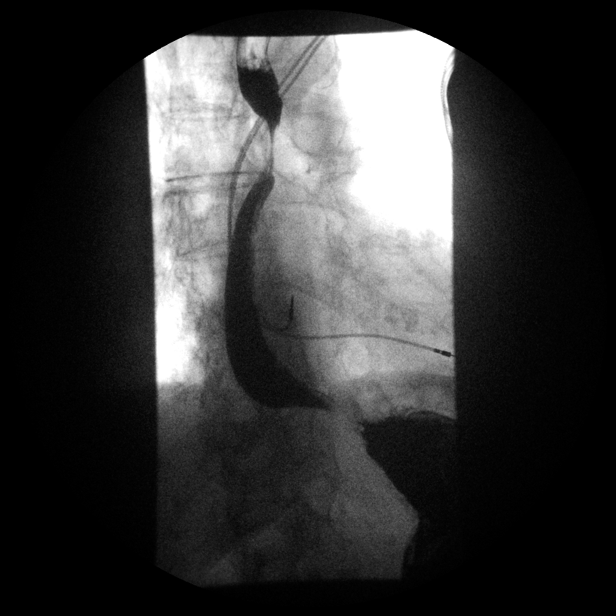

[Series 12: run · 1 of 1 slices shown (14 of 14)]
[im 1/1]
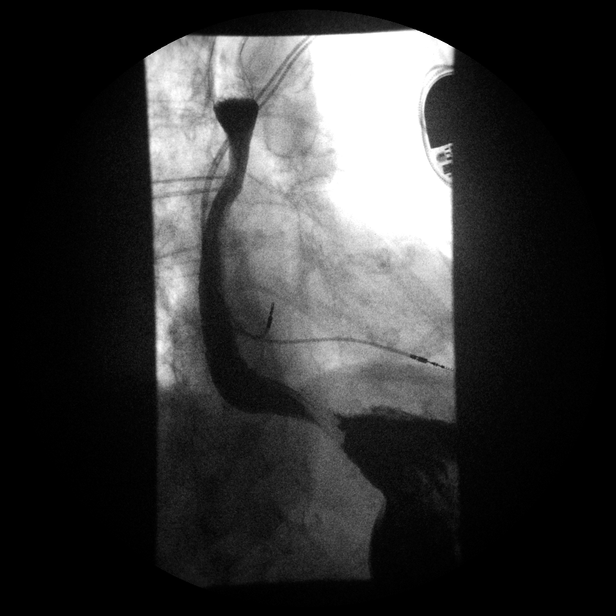

[14 of 24 positions shown; findings below may reference images not displayed]

FINDINGS: Study is performed following modified barium swallow.
Thin barium liquid is used to evaluate the esophagus.

The esophagus is tortuous.  There is poor primary stripping wave.
The lower esophageal sphincter relaxes only intermittently,
appearance favoring achalasia over stricture.  Barium tablet is
upheld just proximal to the gastroesophageal junction, creating
symptoms of food sticking in the upper esophagus.  No mucosal
irregularity is identified throughout the course of the esophagus.

Note is made of contrast within the stomach following modified
barium swallow.
IMPRESSION: 1.  Stricture versus mild achalasia in the lower esophagus.
Achalasia is favored.
2.  No evidence for mucosal irregularity.
3.  Poor primary peristalsis.

## 2015-02-28 ENCOUNTER — Ambulatory Visit (INDEPENDENT_AMBULATORY_CARE_PROVIDER_SITE_OTHER): Payer: Medicare Other | Admitting: *Deleted

## 2015-02-28 DIAGNOSIS — I4891 Unspecified atrial fibrillation: Secondary | ICD-10-CM

## 2015-02-28 DIAGNOSIS — Z5181 Encounter for therapeutic drug level monitoring: Secondary | ICD-10-CM

## 2015-02-28 LAB — POCT INR: INR: 2.6

## 2015-03-05 ENCOUNTER — Encounter: Payer: Medicare Other | Admitting: *Deleted

## 2015-03-07 ENCOUNTER — Encounter: Payer: Self-pay | Admitting: Cardiology

## 2015-03-12 ENCOUNTER — Ambulatory Visit (INDEPENDENT_AMBULATORY_CARE_PROVIDER_SITE_OTHER): Payer: Medicare Other | Admitting: *Deleted

## 2015-03-12 ENCOUNTER — Telehealth: Payer: Self-pay | Admitting: Internal Medicine

## 2015-03-12 DIAGNOSIS — R001 Bradycardia, unspecified: Secondary | ICD-10-CM | POA: Diagnosis not present

## 2015-03-12 NOTE — Telephone Encounter (Signed)
New Message  4. Are you calling to see if we received your device transmission? Pt called states that she forgot to send in her transmission on 08/01. States that she will send in the signal today. Req a call back to determine if it is received. Please call

## 2015-03-12 NOTE — Telephone Encounter (Signed)
Informed pt that her transmission was received.  

## 2015-03-12 NOTE — Progress Notes (Signed)
Remote pacemaker transmission.   

## 2015-03-14 LAB — CUP PACEART REMOTE DEVICE CHECK
Battery Remaining Longevity: 100 mo
Battery Voltage: 2.78 V
Brady Statistic RV Percent Paced: 21 %
Date Time Interrogation Session: 20160808134837
Lead Channel Impedance Value: 67 Ohm
Lead Channel Pacing Threshold Amplitude: 0.625 V
Lead Channel Pacing Threshold Pulse Width: 0.4 ms
Lead Channel Setting Pacing Pulse Width: 0.4 ms
MDC IDC MSMT BATTERY IMPEDANCE: 415 Ohm
MDC IDC MSMT LEADCHNL RV IMPEDANCE VALUE: 496 Ohm
MDC IDC MSMT LEADCHNL RV SENSING INTR AMPL: 8 mV
MDC IDC SET LEADCHNL RV PACING AMPLITUDE: 2.5 V
MDC IDC SET LEADCHNL RV SENSING SENSITIVITY: 5.6 mV

## 2015-04-02 ENCOUNTER — Ambulatory Visit (INDEPENDENT_AMBULATORY_CARE_PROVIDER_SITE_OTHER): Payer: Medicare Other | Admitting: Pharmacist

## 2015-04-02 DIAGNOSIS — I4891 Unspecified atrial fibrillation: Secondary | ICD-10-CM | POA: Diagnosis not present

## 2015-04-02 DIAGNOSIS — Z5181 Encounter for therapeutic drug level monitoring: Secondary | ICD-10-CM

## 2015-04-02 LAB — POCT INR: INR: 2.3

## 2015-04-05 ENCOUNTER — Encounter: Payer: Self-pay | Admitting: Cardiology

## 2015-04-16 ENCOUNTER — Encounter: Payer: Self-pay | Admitting: Internal Medicine

## 2015-05-02 ENCOUNTER — Ambulatory Visit (INDEPENDENT_AMBULATORY_CARE_PROVIDER_SITE_OTHER): Payer: Medicare Other | Admitting: *Deleted

## 2015-05-02 DIAGNOSIS — Z5181 Encounter for therapeutic drug level monitoring: Secondary | ICD-10-CM | POA: Diagnosis not present

## 2015-05-02 DIAGNOSIS — I4891 Unspecified atrial fibrillation: Secondary | ICD-10-CM | POA: Diagnosis not present

## 2015-05-02 LAB — POCT INR: INR: 2.9

## 2015-06-04 ENCOUNTER — Other Ambulatory Visit: Payer: Self-pay | Admitting: Interventional Cardiology

## 2015-06-07 DIAGNOSIS — Z23 Encounter for immunization: Secondary | ICD-10-CM | POA: Diagnosis not present

## 2015-06-13 ENCOUNTER — Ambulatory Visit (INDEPENDENT_AMBULATORY_CARE_PROVIDER_SITE_OTHER): Payer: Medicare Other | Admitting: *Deleted

## 2015-06-13 DIAGNOSIS — I4891 Unspecified atrial fibrillation: Secondary | ICD-10-CM

## 2015-06-13 DIAGNOSIS — Z5181 Encounter for therapeutic drug level monitoring: Secondary | ICD-10-CM | POA: Diagnosis not present

## 2015-06-13 LAB — POCT INR: INR: 1.8

## 2015-07-11 ENCOUNTER — Ambulatory Visit (INDEPENDENT_AMBULATORY_CARE_PROVIDER_SITE_OTHER): Payer: Medicare Other | Admitting: Pharmacist

## 2015-07-11 DIAGNOSIS — Z5181 Encounter for therapeutic drug level monitoring: Secondary | ICD-10-CM

## 2015-07-11 DIAGNOSIS — I4891 Unspecified atrial fibrillation: Secondary | ICD-10-CM | POA: Diagnosis not present

## 2015-07-11 LAB — POCT INR: INR: 3.1

## 2015-08-03 ENCOUNTER — Ambulatory Visit (INDEPENDENT_AMBULATORY_CARE_PROVIDER_SITE_OTHER): Payer: Medicare Other | Admitting: *Deleted

## 2015-08-03 DIAGNOSIS — I4891 Unspecified atrial fibrillation: Secondary | ICD-10-CM

## 2015-08-03 DIAGNOSIS — Z5181 Encounter for therapeutic drug level monitoring: Secondary | ICD-10-CM

## 2015-08-03 LAB — POCT INR: INR: 2.5

## 2015-08-09 ENCOUNTER — Other Ambulatory Visit: Payer: Self-pay | Admitting: Interventional Cardiology

## 2015-08-17 ENCOUNTER — Other Ambulatory Visit: Payer: Self-pay | Admitting: Interventional Cardiology

## 2015-08-20 ENCOUNTER — Other Ambulatory Visit: Payer: Self-pay | Admitting: Interventional Cardiology

## 2015-08-26 ENCOUNTER — Encounter (HOSPITAL_COMMUNITY): Payer: Self-pay | Admitting: Emergency Medicine

## 2015-08-26 ENCOUNTER — Emergency Department (HOSPITAL_COMMUNITY): Payer: Medicare Other

## 2015-08-26 ENCOUNTER — Inpatient Hospital Stay (HOSPITAL_COMMUNITY)
Admission: EM | Admit: 2015-08-26 | Discharge: 2015-08-28 | DRG: 189 | Disposition: A | Discharge status: Home / Self Care | Payer: Medicare Other | Attending: Internal Medicine | Admitting: Internal Medicine

## 2015-08-26 DIAGNOSIS — M25561 Pain in right knee: Secondary | ICD-10-CM | POA: Diagnosis present

## 2015-08-26 DIAGNOSIS — J9621 Acute and chronic respiratory failure with hypoxia: Principal | ICD-10-CM | POA: Diagnosis present

## 2015-08-26 DIAGNOSIS — Z7901 Long term (current) use of anticoagulants: Secondary | ICD-10-CM

## 2015-08-26 DIAGNOSIS — J439 Emphysema, unspecified: Secondary | ICD-10-CM | POA: Diagnosis present

## 2015-08-26 DIAGNOSIS — I351 Nonrheumatic aortic (valve) insufficiency: Secondary | ICD-10-CM | POA: Diagnosis present

## 2015-08-26 DIAGNOSIS — I5033 Acute on chronic diastolic (congestive) heart failure: Secondary | ICD-10-CM | POA: Diagnosis present

## 2015-08-26 DIAGNOSIS — I110 Hypertensive heart disease with heart failure: Secondary | ICD-10-CM | POA: Diagnosis present

## 2015-08-26 DIAGNOSIS — I071 Rheumatic tricuspid insufficiency: Secondary | ICD-10-CM | POA: Diagnosis present

## 2015-08-26 DIAGNOSIS — F419 Anxiety disorder, unspecified: Secondary | ICD-10-CM | POA: Diagnosis present

## 2015-08-26 DIAGNOSIS — R531 Weakness: Secondary | ICD-10-CM | POA: Diagnosis not present

## 2015-08-26 DIAGNOSIS — M25562 Pain in left knee: Secondary | ICD-10-CM | POA: Diagnosis present

## 2015-08-26 DIAGNOSIS — R0602 Shortness of breath: Secondary | ICD-10-CM | POA: Diagnosis not present

## 2015-08-26 DIAGNOSIS — I4891 Unspecified atrial fibrillation: Secondary | ICD-10-CM | POA: Diagnosis present

## 2015-08-26 DIAGNOSIS — I5031 Acute diastolic (congestive) heart failure: Secondary | ICD-10-CM | POA: Diagnosis not present

## 2015-08-26 DIAGNOSIS — I2510 Atherosclerotic heart disease of native coronary artery without angina pectoris: Secondary | ICD-10-CM | POA: Diagnosis present

## 2015-08-26 DIAGNOSIS — R1310 Dysphagia, unspecified: Secondary | ICD-10-CM | POA: Diagnosis present

## 2015-08-26 DIAGNOSIS — R7989 Other specified abnormal findings of blood chemistry: Secondary | ICD-10-CM

## 2015-08-26 DIAGNOSIS — R0600 Dyspnea, unspecified: Secondary | ICD-10-CM | POA: Diagnosis not present

## 2015-08-26 DIAGNOSIS — R5383 Other fatigue: Secondary | ICD-10-CM | POA: Diagnosis present

## 2015-08-26 DIAGNOSIS — I272 Other secondary pulmonary hypertension: Secondary | ICD-10-CM | POA: Diagnosis present

## 2015-08-26 DIAGNOSIS — I509 Heart failure, unspecified: Secondary | ICD-10-CM

## 2015-08-26 DIAGNOSIS — I517 Cardiomegaly: Secondary | ICD-10-CM | POA: Diagnosis not present

## 2015-08-26 LAB — BASIC METABOLIC PANEL
Anion gap: 12 (ref 5–15)
BUN: 26 mg/dL — ABNORMAL HIGH (ref 6–20)
CALCIUM: 10.1 mg/dL (ref 8.9–10.3)
CHLORIDE: 101 mmol/L (ref 101–111)
CO2: 24 mmol/L (ref 22–32)
CREATININE: 0.82 mg/dL (ref 0.44–1.00)
GFR calc non Af Amer: 59 mL/min — ABNORMAL LOW (ref >60)
Glucose, Bld: 117 mg/dL — ABNORMAL HIGH (ref 65–99)
Potassium: 4.5 mmol/L (ref 3.5–5.1)
Sodium: 137 mmol/L (ref 135–145)

## 2015-08-26 LAB — BRAIN NATRIURETIC PEPTIDE: B NATRIURETIC PEPTIDE 5: 215.4 pg/mL — AB (ref 0.0–100.0)

## 2015-08-26 LAB — CBC
HCT: 42.2 % (ref 36.0–46.0)
HEMOGLOBIN: 14.7 g/dL (ref 12.0–15.0)
MCH: 31.7 pg (ref 26.0–34.0)
MCHC: 34.8 g/dL (ref 30.0–36.0)
MCV: 90.9 fL (ref 78.0–100.0)
PLATELETS: 169 10*3/uL (ref 150–400)
RBC: 4.64 MIL/uL (ref 3.87–5.11)
RDW: 15.9 % — ABNORMAL HIGH (ref 11.5–15.5)
WBC: 9.2 10*3/uL (ref 4.0–10.5)

## 2015-08-26 LAB — I-STAT TROPONIN, ED: TROPONIN I, POC: 0.03 ng/mL (ref 0.00–0.08)

## 2015-08-26 LAB — PROTIME-INR
INR: 2.7 — AB (ref 0.00–1.49)
PROTHROMBIN TIME: 28.3 s — AB (ref 11.6–15.2)

## 2015-08-26 MED ORDER — PREGABALIN 100 MG PO CAPS
100.0000 mg | ORAL_CAPSULE | Freq: Two times a day (BID) | ORAL | Status: DC
  Administered 2015-08-26 – 2015-08-28 (×4): 100 mg via ORAL
  Filled 2015-08-26 (×4): qty 1

## 2015-08-26 MED ORDER — SODIUM CHLORIDE 0.9 % IV BOLUS (SEPSIS)
500.0000 mL | Freq: Once | INTRAVENOUS | Status: AC
  Administered 2015-08-26: 500 mL via INTRAVENOUS

## 2015-08-26 MED ORDER — POTASSIUM CHLORIDE CRYS ER 20 MEQ PO TBCR
20.0000 meq | EXTENDED_RELEASE_TABLET | Freq: Every day | ORAL | Status: DC
  Administered 2015-08-26 – 2015-08-28 (×3): 20 meq via ORAL
  Filled 2015-08-26 (×3): qty 1

## 2015-08-26 MED ORDER — LOSARTAN POTASSIUM 50 MG PO TABS
100.0000 mg | ORAL_TABLET | Freq: Every day | ORAL | Status: DC
  Administered 2015-08-27 – 2015-08-28 (×2): 100 mg via ORAL
  Filled 2015-08-26 (×2): qty 2

## 2015-08-26 MED ORDER — WARFARIN - PHYSICIAN DOSING INPATIENT
Freq: Every day | Status: DC
  Administered 2015-08-27: 20:00:00

## 2015-08-26 MED ORDER — CLONAZEPAM 0.5 MG PO TABS
0.5000 mg | ORAL_TABLET | Freq: Every day | ORAL | Status: DC
  Administered 2015-08-26 – 2015-08-27 (×2): 0.5 mg via ORAL
  Filled 2015-08-26 (×2): qty 1

## 2015-08-26 MED ORDER — FUROSEMIDE 10 MG/ML IJ SOLN
20.0000 mg | Freq: Once | INTRAMUSCULAR | Status: AC
  Administered 2015-08-26: 20 mg via INTRAVENOUS
  Filled 2015-08-26: qty 2

## 2015-08-26 MED ORDER — DILTIAZEM HCL ER COATED BEADS 180 MG PO CP24
360.0000 mg | ORAL_CAPSULE | Freq: Every day | ORAL | Status: DC
  Administered 2015-08-27 – 2015-08-28 (×2): 360 mg via ORAL
  Filled 2015-08-26 (×2): qty 2
  Filled 2015-08-26: qty 1

## 2015-08-26 MED ORDER — IPRATROPIUM-ALBUTEROL 0.5-2.5 (3) MG/3ML IN SOLN: 3.0000 mL | Freq: Four times a day (QID) | RESPIRATORY_TRACT | Status: DC | PRN

## 2015-08-26 MED ORDER — WARFARIN SODIUM 2.5 MG PO TABS
3.7500 mg | ORAL_TABLET | Freq: Every day | ORAL | Status: DC
  Filled 2015-08-26 (×2): qty 1

## 2015-08-26 MED ORDER — MAGNESIUM OXIDE 400 (241.3 MG) MG PO TABS
ORAL_TABLET | Freq: Two times a day (BID) | ORAL | Status: DC
  Administered 2015-08-27 – 2015-08-28 (×3): 400 mg via ORAL
  Filled 2015-08-26 (×3): qty 1

## 2015-08-26 MED ORDER — FUROSEMIDE 40 MG PO TABS
40.0000 mg | ORAL_TABLET | Freq: Every day | ORAL | Status: DC
  Administered 2015-08-27 – 2015-08-28 (×2): 40 mg via ORAL
  Filled 2015-08-26 (×2): qty 1

## 2015-08-26 NOTE — ED Notes (Signed)
Pt was provided [place] sand which and water.

## 2015-08-26 NOTE — ED Notes (Signed)
Pt O2 sats 87% consistently. Placed on 2L O2 via [employer]. O2 sats improved to 94%.

## 2015-08-26 NOTE — ED Provider Notes (Signed)
CSN: [id number]     Arrival date & time 08/26/15  1229 History   First MD Initiated Contact with Patient 08/26/15 1656     Chief Complaint  Patient presents with  . Shortness of Breath     (Consider location/radiation/quality/duration/timing/severity/associated sxs/prior Treatment) HPI Patient presents with concern of dyspnea, fatigue. Symptoms began about 3 weeks ago. Seemingly symptoms have become worse over the past 2 days. There is no pain either at rest or with exertion. However, there is new worsening dyspnea with exertion. Fatigue is persistent, both at rest and with exertion. Patient has one recent medication change, increasing antihypertensives about 3 weeks ago. Recently, no fever, chills, cough, nausea, vomiting, diarrhea. Today, with increasing fatigue with exertion, she presents for evaluation.  Past Medical History  Diagnosis Date  . Atrial fibrillation (HCC)   . HTN (hypertension)   . Anxiety   . GERD (gastroesophageal reflux disease)   . Insomnia   . Coronary atherosclerosis of native coronary artery   . Dysphagia   . CAD (coronary artery disease)   . Burning mouth syndrome    Past Surgical History  Procedure Laterality Date  . Tonsillectomy    . Breast lumpectomy    . Kidney stone surgery     Family History  Problem Relation Age of Onset  . Liver cancer Sister   . Leukemia Mother     ACUTE  . Bladder Cancer Father   . Bladder Cancer Brother   . Bladder Cancer Paternal Grandfather    Social History  Substance Use Topics  . Smoking status: Never Smoker   . Smokeless tobacco: Never Used  . Alcohol Use: No   OB History    No data available     Review of Systems  Constitutional:       Per HPI, otherwise negative  HENT:       Per HPI, otherwise negative  Respiratory:       Per HPI, otherwise negative  Cardiovascular:       Per HPI, otherwise negative  Gastrointestinal: Negative for vomiting.  Endocrine:       Negative aside from HPI   Genitourinary:       Neg aside from HPI   Musculoskeletal:       Per HPI, otherwise negative  Skin: Negative.   Neurological: Negative for syncope.      Allergies  Metoprolol and Cortisone  Home Medications   Prior to Admission medications   Medication Sig Start Date End Date Taking? Authorizing Provider  clonazePAM (KLONOPIN) 0.5 MG tablet Take 1 tablet by mouth daily. 04/29/13   Historical Provider, MD  diltiazem (CARDIZEM LA) 360 MG 24 hr tablet TAKE ONE TABLET BY MOUTH ONCE DAILY 08/17/15   Corky Crafts, MD  furosemide (LASIX) 40 MG tablet TAKE ONE TABLET BY MOUTH ONCE DAILY 08/21/15   Corky Crafts, MD  KLOR-CON M20 20 MEQ tablet TAKE ONE TABLET BY MOUTH ONCE DAILY 08/10/15   Corky Crafts, MD  losartan (COZAAR) 100 MG tablet Take 1 tablet (100 mg total) by mouth daily. 09/01/14   Corky Crafts, MD  LYRICA 50 MG capsule Take 2 capsules by mouth 2 (two) times daily.  04/26/13   Historical Provider, MD  medroxyPROGESTERone (PROVERA) 10 MG tablet Take 1 tablet by mouth daily. 04/21/13   Historical Provider, MD  PREMARIN 0.625 MG tablet Take 1 tablet by mouth daily. 04/21/13   Historical Provider, MD  sertraline (ZOLOFT) 100 MG tablet Take 1/4 tab by  mouth once daily 07/13/13   Historical Provider, MD  TAZTIA XT 300 MG 24 hr capsule TAKE ONE CAPSULE BY MOUTH ONCE DAILY 01/26/15   Corky Crafts, MD  warfarin (COUMADIN) 2.5 MG tablet TAKE AS DIRECTED BY  COUMADIN  CLINIC 06/05/15   Corky Crafts, MD   BP 142/71 mmHg  Pulse 79  Temp(Src) 98.1 F (36.7 C) (Oral)  Resp 15  SpO2 94% Physical Exam  Constitutional: She is oriented to person, place, and time. She appears well-developed and well-nourished. No distress.  HENT:  Head: Normocephalic and atraumatic.  Eyes: Conjunctivae and EOM are normal.  Cardiovascular: Normal rate and regular rhythm.   Murmur heard. Pulmonary/Chest: No stridor. She has decreased breath sounds. She has no wheezes.   Abdominal: She exhibits no distension.  Musculoskeletal: She exhibits no edema.  Neurological: She is alert and oriented to person, place, and time. No cranial nerve deficit.  Skin: Skin is warm and dry.  Psychiatric: She has a normal mood and affect.  Nursing note and vitals reviewed.   ED Course  Procedures (including critical care time) Labs Review Labs Reviewed  BASIC METABOLIC PANEL - Abnormal; Notable for the following:    Glucose, Bld 117 (*)    BUN 26 (*)    GFR calc non Af Amer 59 (*)    All other components within normal limits  CBC - Abnormal; Notable for the following:    RDW 15.9 (*)    All other components within normal limits  BRAIN NATRIURETIC PEPTIDE - Abnormal; Notable for the following:    B Natriuretic Peptide 215.4 (*)    All other components within normal limits  PROTIME-INR - Abnormal; Notable for the following:    Prothrombin Time 28.3 (*)    INR 2.70 (*)    All other components within normal limits  I-STAT TROPOININ, ED    Imaging Review Dg Chest 2 View  08/26/2015  CLINICAL DATA:  Shortness of breath since Christmas. EXAM: CHEST  2 VIEW COMPARISON:  05/21/2012 FINDINGS: The heart is enlarged but stable. The pacer wires are stable. There is tortuosity and calcification of the thoracic aorta. The lungs demonstrate severe chronic bronchitic and emphysematous changes but no acute pulmonary infiltrates, effusions or edema. No acute bony findings. Stable remote mid thoracic compression deformity. IMPRESSION: Chronic emphysematous and bronchitic lung changes without definite acute overlying pulmonary process. Electronically Signed   By: Rudie Meyer M.D.   On: 08/26/2015 13:36   I have personally reviewed and evaluated these images and lab results as part of my medical decision-making.   EKG Interpretation   Date/Time:  Sunday August 26 2015 12:37:17 EST Ventricular Rate:  80 PR Interval:    QRS Duration: 84 QT Interval:  364 QTC Calculation: 419 R  Axis:   -93 Text Interpretation:  Demand pacemaker; interpretation is based on  intrinsic rhythm Atrial fibrillation with premature ventricular or  aberrantly conducted complexes Right superior axis deviation Nonspecific  ST and T wave abnormality , probably digitalis effect Abnormal ECG  VENTRICULAR PACED RHYTHM Atrial fibrillation Abnormal ekg Confirmed by  Gerhard Munch  MD ([id number]) on 08/26/2015 4:57:47 PM     o2- 87%ra low Cardiac: 80 sr, nml  EMR notable for no recent ECHO  7:56 PM With supplemental oxygen patient now has oxygen saturation in the mid 90% range. She and her husband are aware of all findings.  MDM  Elderly female presents with weakness, dyspnea, worsening with exertion. Patient has no history of  congestive heart failure, no recent echocardiogram.  Evaluation notable for elevated BNP, and given her instructions dyspnea, both with exertion and rest, the patient was admitted for further evaluation, management, consideration of echocardiogram.   Gerhard Munch, MD 08/26/15 1956

## 2015-08-26 NOTE — ED Notes (Signed)
Attempted Report 

## 2015-08-26 NOTE — ED Notes (Signed)
Pt c/o shortness of breath onset Christmas increasing past day or two. Pt talking in complete sentences.

## 2015-08-26 NOTE — Progress Notes (Signed)
Pt. Placed on bed alarm due to fall risk score. RN informed pt. Of reason for bed alarm. Pt. Refusing.

## 2015-08-26 NOTE — Progress Notes (Signed)
Pt. States she takes Melatonin and Clonazepam at bedtime. On call for [facility] paged.

## 2015-08-26 NOTE — H&P (Signed)
History and Physical  [name] ZOX:[id number] DOB: [birth year] DOA: 08/26/2015  PCP: [name], MD   Chief Complaint: Fatigue   History of Present Illness:   Patient is a 82 yo pleasant female with history of Afib and HTN who came here with cc of fatigue. She said she has not been feeling well since Christmas but this has gotten worse last week. She said she is a little short of breath but denied any orthopnea, PND, chest pain, , cough, N/V/D/C/abd pain, fever/chills.  She has chronic knee pain with R>L lower leg swelling that may have gotten a little worse over the last couple of weeks. She said she is complaint with her meds. She still can do her daily activities inside her house.   Review of Systems:  CONSTITUTIONAL:  No night sweats.  +fatigue, malaise, lethargy.  No fever or chills. Eyes:  No visual changes.  No eye pain.  No eye discharge.   ENT:    No epistaxis.  No sinus pain.  No sore throat.  No ear pain.  No congestion. RESPIRATORY:  No cough.  No wheeze.  No hemoptysis.  +/- shortness of breath. CARDIOVASCULAR:  No chest pains.  No palpitations. GASTROINTESTINAL:  No abdominal pain.  No nausea or vomiting.  No diarrhea or constipation.  No hematemesis.  No hematochezia.  No melena. GENITOURINARY:  No urgency.  No frequency.  No dysuria.  No hematuria.  No obstructive symptoms.  No discharge.  No pain.  No significant abnormal bleeding. MUSCULOSKELETAL:  +musculoskeletal pain.  No joint swelling.  No arthritis. NEUROLOGICAL:  No confusion.  No weakness. No headache. No seizure. PSYCHIATRIC:  No depression. No anxiety. No suicidal ideation. SKIN:  No rashes.  No lesions.  No wounds. ENDOCRINE:  No unexplained weight loss.  No polydipsia.  No polyuria.  No polyphagia. HEMATOLOGIC:  No anemia.  No purpura.  No petechiae.  No bleeding.  ALLERGIC AND IMMUNOLOGIC:  No pruritus.  No swelling Other:  Past Medical and Surgical History:   Past Medical History    Diagnosis Date  . Atrial fibrillation (HCC)   . HTN (hypertension)   . Anxiety   . GERD (gastroesophageal reflux disease)   . Insomnia   . Coronary atherosclerosis of native coronary artery   . Dysphagia   . CAD (coronary artery disease)   . Burning mouth syndrome    Past Surgical History  Procedure Laterality Date  . Tonsillectomy    . Breast lumpectomy    . Kidney stone surgery      Social History:   reports that she has never smoked. She has never used smokeless tobacco. She reports that she does not drink alcohol or use illicit drugs.   Allergies  Allergen Reactions  . Cortisone Other (See Comments)    REACTION: "STOMACH BURNS"  . Metoprolol Diarrhea    Family History  Problem Relation Age of Onset  . Liver cancer Sister   . Leukemia Mother     ACUTE  . Bladder Cancer Father   . Bladder Cancer Brother   . Bladder Cancer Paternal Grandfather       Prior to Admission medications   Medication Sig Start Date End Date Taking? Authorizing Provider  Ascorbic Acid (VITAMIN C PO) Take 1 tablet by mouth daily.   Yes Historical Provider, MD  CALCIUM-VITAMIN D PO Take 1 tablet by mouth daily.   Yes Historical Provider, MD  CHOLECALCIFEROL PO Take 1 tablet by mouth daily.   Yes  Historical Provider, MD  Coenzyme Q10 (COQ10 PO) Take 1 capsule by mouth daily.   Yes Historical Provider, MD  furosemide (LASIX) 40 MG tablet TAKE ONE TABLET BY MOUTH ONCE DAILY 08/21/15  Yes Corky Crafts, MD  KLOR-CON M20 20 MEQ tablet TAKE ONE TABLET BY MOUTH ONCE DAILY 08/10/15  Yes Corky Crafts, MD  losartan (COZAAR) 100 MG tablet Take 1 tablet (100 mg total) by mouth daily. 09/01/14  Yes Corky Crafts, MD  LYRICA 50 MG capsule Take 2 capsules by mouth 2 (two) times daily.  04/26/13  Yes Historical Provider, MD  MAGNESIUM PO Take 1 tablet by mouth 2 (two) times daily.   Yes Historical Provider, MD  medroxyPROGESTERone (PROVERA) 10 MG tablet Take 1 tablet by mouth daily. 04/21/13   Yes Historical Provider, MD  Omega-3 Fatty Acids (FISH OIL PO) Take 1 capsule by mouth 2 (two) times daily.   Yes Historical Provider, MD  TAZTIA XT 300 MG 24 hr capsule TAKE ONE CAPSULE BY MOUTH ONCE DAILY 01/26/15  Yes Corky Crafts, MD  VITAMIN A PO Take 1 capsule by mouth daily.   Yes Historical Provider, MD  VITAMIN E PO Take 1 capsule by mouth daily.   Yes Historical Provider, MD  warfarin (COUMADIN) 2.5 MG tablet TAKE AS DIRECTED BY  COUMADIN  CLINIC Patient taking differently: TAKES 3.75MG  ALL DAYS OF WEEK, TAKES  ON TUES ONLY IN EVENINGS 06/05/15  Yes Corky Crafts, MD  diltiazem (CARDIZEM LA) 360 MG 24 hr tablet TAKE ONE TABLET BY MOUTH ONCE DAILY 08/17/15   Corky Crafts, MD    Physical Exam: BP 132/50 mmHg  Pulse 70  Temp(Src) 98.1 F (36.7 C) (Oral)  Resp 15  SpO2 94%  GENERAL : Well developed, well nourished, alert and cooperative, and appears to be in no acute distress. HEAD: normocephalic. EYES:vision is grossly intact. EARS:  hearing grossly intact. NOSE: No nasal discharge. THROAT: Oral cavity and pharynx normal.   NECK: Neck supple CARDIAC: Normal S1 and S2. No S3, S4 or murmurs. Rhythm is regular. There is mild peripheral edema,. LUNGS: Clear to auscultation  ABDOMEN: Positive bowel sounds. Soft, nondistended, nontender. No guarding or rebound. No masses. NEUROLOGICAL: The mental examination revealed the patient was oriented to person, place, and time.CN II-XII intact.  PSYCHIATRIC:  The patient was able to demonstrate good judgement and reason, without hallucinations, abnormal affect or abnormal behaviors during the examination. Patient is not suicidal.          Labs on Admission:  Reviewed.   Radiological Exams on Admission: Dg Chest 2 View  08/26/2015  CLINICAL DATA:  Shortness of breath since Christmas. EXAM: CHEST  2 VIEW COMPARISON:  05/21/2012 FINDINGS: The heart is enlarged but stable. The pacer wires are stable. There is tortuosity  and calcification of the thoracic aorta. The lungs demonstrate severe chronic bronchitic and emphysematous changes but no acute pulmonary infiltrates, effusions or edema. No acute bony findings. Stable remote mid thoracic compression deformity. IMPRESSION: Chronic emphysematous and bronchitic lung changes without definite acute overlying pulmonary process. Electronically Signed   By: Rudie Meyer M.D.   On: 08/26/2015 13:36    EKG:  Independently reviewed. Afib  Assessment/Plan  Fatigue/dyspnea: No wheezing and speaks in full sentences. COPD exacerbation is less likely, placed on albuterol prn. Possible acute heart failure. Will check Echo. Will give lasix 20 IV today and cont 40 mg PO daily. Careful monitoring of BP.  Possible due to UTI: will check  UA.  PT consult before discharge.  Home oxygen eval before discharge.   Afib: cont coumadin . Rate controlled.   HTN: cont home meds   DVT prophylaxis: on coumadin  Code Status: Full     Eston Esters M.D [facility]

## 2015-08-27 ENCOUNTER — Ambulatory Visit (HOSPITAL_COMMUNITY): Payer: Medicare Other

## 2015-08-27 DIAGNOSIS — R0600 Dyspnea, unspecified: Secondary | ICD-10-CM

## 2015-08-27 DIAGNOSIS — I5031 Acute diastolic (congestive) heart failure: Secondary | ICD-10-CM

## 2015-08-27 LAB — BASIC METABOLIC PANEL
ANION GAP: 11 (ref 5–15)
BUN: 27 mg/dL — ABNORMAL HIGH (ref 6–20)
CALCIUM: 8.7 mg/dL — AB (ref 8.9–10.3)
CO2: 26 mmol/L (ref 22–32)
CREATININE: 0.87 mg/dL (ref 0.44–1.00)
Chloride: 102 mmol/L (ref 101–111)
GFR, EST NON AFRICAN AMERICAN: 55 mL/min — AB (ref >60)
Glucose, Bld: 90 mg/dL (ref 65–99)
Potassium: 4 mmol/L (ref 3.5–5.1)
SODIUM: 139 mmol/L (ref 135–145)

## 2015-08-27 LAB — URINALYSIS, ROUTINE W REFLEX MICROSCOPIC
BILIRUBIN URINE: NEGATIVE
Glucose, UA: NEGATIVE mg/dL
KETONES UR: NEGATIVE mg/dL
Leukocytes, UA: NEGATIVE
Nitrite: NEGATIVE
PROTEIN: NEGATIVE mg/dL
Specific Gravity, Urine: 1.005 (ref 1.005–1.030)
pH: 7 (ref 5.0–8.0)

## 2015-08-27 LAB — URINE MICROSCOPIC-ADD ON
Bacteria, UA: NONE SEEN
WBC UA: NONE SEEN WBC/hpf (ref 0–5)

## 2015-08-27 LAB — TSH: TSH: 1.344 u[IU]/mL (ref 0.350–4.500)

## 2015-08-27 MED ORDER — FUROSEMIDE 10 MG/ML IJ SOLN
20.0000 mg | Freq: Two times a day (BID) | INTRAMUSCULAR | Status: DC
  Administered 2015-08-27 – 2015-08-28 (×3): 20 mg via INTRAVENOUS
  Filled 2015-08-27 (×3): qty 2

## 2015-08-27 MED ORDER — WARFARIN SODIUM 7.5 MG PO TABS
3.7500 mg | ORAL_TABLET | Freq: Every day | ORAL | Status: DC
  Administered 2015-08-27: 3.75 mg via ORAL
  Filled 2015-08-27: qty 0.5

## 2015-08-27 NOTE — Progress Notes (Signed)
[facility] PROGRESS NOTE  [name] ZOX:[id number] DOB: [birth year] DOA: 08/26/2015 PCP: [name], MD  Length of stay: 1   Assessment/Plan: Active Problems:   Dyspnea   Fatigue   Brief summary 82 yo pleasant female with history of Afib and HTN who came here with cc of fatigue. She said she has not been feeling well since Christmas but this has gotten worse last week. She said she is a little short of breath but denied any orthopnea, PND, chest pain, , cough, N/V/D/C/abd pain, fever/chills. She has chronic knee pain with R>L lower leg swelling that may have gotten a little worse over the last couple of weeks. She said she is complaint with her meds. She still can do her daily activities inside her house.    Assessment and plan Acute on chronic hypoxemic respiratory failure Chest x-ray shows chronic emphysema, no definite pneumonia Check 2-D echo, r/o chf , currently on 3 L of oxygen Patient currently on Lasix, continue telemetry, continue to cycle cardiac enzymes Doubt pulmonary embolism event INR which was therapeutic upon admission Followed by Dr Eldridge Dace every 6 months   Atrial fibrillation Rate controlled on Cardizem, continue Coumadin per pharmacy Intolerant to metoprolol in the past No recent history of falls  Hypertension -continue Cardizem, Cozaar  Anxiety -continue Klonopin, Lyrica   Dysphagia-SLP evaluation   DVT prophylaxsis Coumadin  Code Status:      Code Status Orders        Start     Ordered   08/26/15 2049  Full code   Continuous     08/26/15 2049       Family Communication: Discussed in detail with the patient, all imaging results, lab results explained to the patient   Disposition Plan:  PT/OT evaluation      Consultants:  None  Procedures:  None  Antibiotics: Anti-infectives    None         HPI/Subjective: Patient states she feels a lot better than yesterday   Objective: Filed Vitals:    08/26/15 2045 08/26/15 2100 08/26/15 2135 08/27/15 0536  BP: 112/69 135/56 142/49 123/52  Pulse: 85  86 72  Temp:   97.8 F (36.6 C) 98 F (36.7 C)  TempSrc:   Oral Oral  Resp: Height:    (1.6 m)   Weight:   62.642 kg (138 lb 1.6 oz) 60.419 kg (133 lb 3.2 oz)  SpO2: 95% 94% 96% 98%    Intake/Output Summary (Last 24 hours) at 08/27/15 8119 Last data filed at 08/27/15 1478  Gross per 24 hour  Intake    240 ml  Output    700 ml  Net   -460 ml    Exam:  GEN: Well nourished, well developed, in no acute distress  HEENT: normal  Neck: no JVD, carotid bruits, or masses Cardiac: Irregularly irregular; no murmurs, rubs, or gallops,no edema  Respiratory: clear to auscultation bilaterally, normal work of breathing GI: soft, nontender, nondistended, + BS MS: no deformity or atrophy  Skin: warm and dry, no rash, shins are discolored-chronic Neuro: Strength and sensation are intact Psych: euthymic mood, full affect  Data Review   Micro Results No results found for this or any previous visit (from the past 240 hour(s)).  Radiology Reports Dg Chest 2 View  08/26/2015  CLINICAL DATA:  Shortness of breath since Christmas. EXAM: CHEST  2 VIEW COMPARISON:  05/21/2012 FINDINGS: The heart is enlarged  but stable. The pacer wires are stable. There is tortuosity and calcification of the thoracic aorta. The lungs demonstrate severe chronic bronchitic and emphysematous changes but no acute pulmonary infiltrates, effusions or edema. No acute bony findings. Stable remote mid thoracic compression deformity. IMPRESSION: Chronic emphysematous and bronchitic lung changes without definite acute overlying pulmonary process. Electronically Signed   By: Rudie Meyer M.D.   On: 08/26/2015 13:36     CBC  Recent Labs Lab 08/26/15 1253  WBC 9.2  HGB 14.7  HCT 42.2  PLT 169  MCV 90.9  MCH 31.7  MCHC 34.8  RDW 15.9*    Chemistries   Recent Labs Lab 08/26/15 1253 08/27/15 0405   NA 137 139  K 4.5 4.0  CL 101 102  CO2 24 26  GLUCOSE 117* 90  BUN 26* 27*  CREATININE 0.82 0.87  CALCIUM 10.1 8.7*   ------------------------------------------------------------------------------------------------------------------ estimated creatinine clearance is 32.7 mL/min (by C-G formula based on Cr of 0.87). ------------------------------------------------------------------------------------------------------------------ No results for input(s): HGBA1C in the last 72 hours. ------------------------------------------------------------------------------------------------------------------ No results for input(s): CHOL, HDL, LDLCALC, TRIG, CHOLHDL, LDLDIRECT in the last 72 hours. ------------------------------------------------------------------------------------------------------------------  Recent Labs  08/27/15 0405  TSH 1.344   ------------------------------------------------------------------------------------------------------------------ No results for input(s): VITAMINB12, FOLATE, FERRITIN, TIBC, IRON, RETICCTPCT in the last 72 hours.  Coagulation profile  Recent Labs Lab 08/26/15 1800  INR 2.70*    No results for input(s): DDIMER in the last 72 hours.  Cardiac Enzymes No results for input(s): CKMB, TROPONINI, MYOGLOBIN in the last 168 hours.  Invalid input(s): CK ------------------------------------------------------------------------------------------------------------------ Invalid input(s): POCBNP   CBG: No results for input(s): GLUCAP in the last 168 hours.     Studies: Dg Chest 2 View  08/26/2015  CLINICAL DATA:  Shortness of breath since Christmas. EXAM: CHEST  2 VIEW COMPARISON:  05/21/2012 FINDINGS: The heart is enlarged but stable. The pacer wires are stable. There is tortuosity and calcification of the thoracic aorta. The lungs demonstrate severe chronic bronchitic and emphysematous changes but no acute pulmonary infiltrates, effusions or  edema. No acute bony findings. Stable remote mid thoracic compression deformity. IMPRESSION: Chronic emphysematous and bronchitic lung changes without definite acute overlying pulmonary process. Electronically Signed   By: Rudie Meyer M.D.   On: 08/26/2015 13:36      No results found for: HGBA1C Lab Results  Component Value Date   CREATININE 0.87 08/27/2015       Scheduled Meds: . clonazePAM  0.5 mg Oral QHS  . diltiazem  360 mg Oral Daily  . furosemide  40 mg Oral Daily  . losartan  100 mg Oral Daily  . magnesium oxide   Oral BID  . potassium chloride SA  20 mEq Oral Daily  . pregabalin  100 mg Oral BID  . warfarin  3.75 mg Oral q1800  . Warfarin - Physician Dosing Inpatient   Does not apply q1800   Continuous Infusions:   Active Problems:   Dyspnea   Fatigue    Time spent: 45 minutes   [facility]  [facility] Pager [phone]. If 7PM-7AM, please contact night-coverage at [email or web address], password [facility] 08/27/2015, 9:25 AM  LOS: 1 day

## 2015-08-27 NOTE — Progress Notes (Signed)
  Echocardiogram 2D Echocardiogram has been performed.  Janalyn Harder 08/27/2015, 2:32 PM

## 2015-08-28 ENCOUNTER — Inpatient Hospital Stay (HOSPITAL_COMMUNITY): Payer: Medicare Other

## 2015-08-28 ENCOUNTER — Telehealth: Payer: Self-pay | Admitting: Internal Medicine

## 2015-08-28 DIAGNOSIS — I509 Heart failure, unspecified: Secondary | ICD-10-CM

## 2015-08-28 LAB — COMPREHENSIVE METABOLIC PANEL
ALBUMIN: 3.1 g/dL — AB (ref 3.5–5.0)
ALT: 26 U/L (ref 14–54)
AST: 27 U/L (ref 15–41)
Alkaline Phosphatase: 53 U/L (ref 38–126)
Anion gap: 8 (ref 5–15)
BUN: 23 mg/dL — AB (ref 6–20)
CALCIUM: 8.6 mg/dL — AB (ref 8.9–10.3)
CHLORIDE: 104 mmol/L (ref 101–111)
CO2: 29 mmol/L (ref 22–32)
Creatinine, Ser: 0.78 mg/dL (ref 0.44–1.00)
GFR calc Af Amer: >60 mL/min (ref >60)
GLUCOSE: 87 mg/dL (ref 65–99)
POTASSIUM: 4 mmol/L (ref 3.5–5.1)
Sodium: 141 mmol/L (ref 135–145)
TOTAL PROTEIN: 6.3 g/dL — AB (ref 6.5–8.1)
Total Bilirubin: 0.7 mg/dL (ref 0.3–1.2)

## 2015-08-28 LAB — CBC
HCT: 39.6 % (ref 36.0–46.0)
Hemoglobin: 13.5 g/dL (ref 12.0–15.0)
MCH: 31.3 pg (ref 26.0–34.0)
MCHC: 34.1 g/dL (ref 30.0–36.0)
MCV: 91.9 fL (ref 78.0–100.0)
PLATELETS: 159 10*3/uL (ref 150–400)
RBC: 4.31 MIL/uL (ref 3.87–5.11)
RDW: 15.7 % — AB (ref 11.5–15.5)
WBC: 6.1 10*3/uL (ref 4.0–10.5)

## 2015-08-28 LAB — TROPONIN I: Troponin I: 0.04 ng/mL — ABNORMAL HIGH (ref <0.031)

## 2015-08-28 LAB — PROTIME-INR
INR: 1.76 — ABNORMAL HIGH (ref 0.00–1.49)
PROTHROMBIN TIME: 20.5 s — AB (ref 11.6–15.2)

## 2015-08-28 MED ORDER — LOSARTAN POTASSIUM 25 MG PO TABS: 25.0000 mg | ORAL_TABLET | Freq: Every day | ORAL | Status: DC

## 2015-08-28 MED ORDER — WARFARIN SODIUM 5 MG PO TABS: 5.0000 mg | ORAL_TABLET | Freq: Once | ORAL | Status: DC

## 2015-08-28 MED ORDER — WARFARIN - PHARMACIST DOSING INPATIENT: Freq: Every day | Status: DC

## 2015-08-28 NOTE — Telephone Encounter (Signed)
TOC per Rosann Auerbach- pt had appt already sched for 1/31 w/ Dr Ladona Ridgel-  Per Trish/staff message- Please schedule a 1 wk APP TOC appt;;; ah

## 2015-08-28 NOTE — Evaluation (Signed)
Occupational Therapy Evaluation Patient Details Name: [name] MRN: [id number] DOB: 12/02/1918 Today's Date: 08/28/2015    History of Present Illness 82 yo female admitted with acute on chronic hypoxemic respiratory failure  PMH: Afib, Htn,    Clinical Impression   PT admitted with acute hypoxemic respiratory failure. Pt currently with functional limitiations due to the deficits listed below (see OT problem list). PTA independent with all adls. Pt will benefit from skilled OT to increase their independence and safety with adls and balance to allow discharge home without follow up. Pt able to complete sink level ADL on RA 99%.     Follow Up Recommendations  No OT follow up    Equipment Recommendations  None recommended by OT    Recommendations for Other Services       Precautions / Restrictions Precautions Precautions: Fall Precaution Comments: oxygen       Mobility Bed Mobility Overal bed mobility: Modified Independent                Transfers Overall transfer level: Needs assistance   Transfers: Sit to/from Stand Sit to Stand: Min guard              Balance                                            ADL Overall ADL's : Needs assistance/impaired     Grooming: Oral care;Wash/dry face;Wash/dry hands;Min guard;Standing Grooming Details (indicate cue type and reason): pt reports tooth brush is too soft and she needs to brush for 15 minutes to feel clean Upper Body Bathing: Min guard;Standing   Lower Body Bathing: Min guard;Sit to/from stand           Toilet Transfer: Min guard             General ADL Comments: pt completed ADL at sink level with Oxygen level 99% on RA.      Vision Additional Comments: noted L eye redness on lateral aspect   Perception     Praxis      Pertinent Vitals/Pain Pain Assessment: No/denies pain     Hand Dominance Right   Extremity/Trunk Assessment Upper Extremity  Assessment Upper Extremity Assessment: Generalized weakness   Lower Extremity Assessment Lower Extremity Assessment: Defer to PT evaluation   Cervical / Trunk Assessment Cervical / Trunk Assessment: Kyphotic   Communication Communication Communication: No difficulties   Cognition Arousal/Alertness: Awake/alert Behavior During Therapy: WFL for tasks assessed/performed Overall Cognitive Status: Within Functional Limits for tasks assessed                     General Comments       Exercises       Shoulder Instructions      Home Living Family/patient expects to be discharged to:: Private residence Living Arrangements: Spouse/significant other Available Help at Discharge: Family;Available 24 hours/day Type of Home: House Home Access: Stairs to enter [employer] of Steps: 4 Entrance Stairs-Rails: Right;Can reach both Home Layout: Multi-level     Bathroom Shower/Tub: Producer, television/film/video: Standard     Home Equipment: Shower seat;Grab bars - tub/shower   Additional Comments: drivers license      Prior Functioning/Environment Level of Independence: Independent             OT Diagnosis: Generalized weakness   OT Problem List:  Decreased strength;Decreased activity tolerance;Impaired balance (sitting and/or standing)   OT Treatment/Interventions: Self-care/ADL training;Therapeutic exercise;Energy conservation;DME and/or AE instruction;Therapeutic activities;Balance training;Patient/family education    OT Goals(Current goals can be found in the care plan section) Acute Rehab OT Goals Patient Stated Goal: to return to church OT Goal Formulation: With patient Time For Goal Achievement: 09/11/15 Potential to Achieve Goals: Good  OT Frequency: Min 2X/week   Barriers to D/C:            Co-evaluation              End of Session Nurse Communication: Mobility status;Precautions  Activity Tolerance: Patient tolerated treatment  well Patient left: in chair;with call bell/phone within reach   Time: 0759-0821 OT Time Calculation (min): 22 min Charges:  OT General Charges $OT Visit: 1 Procedure OT Evaluation $OT Eval Low Complexity: 1 Procedure G-Codes:    Harolyn Rutherford 08/31/2015, 8:33 AM   Mateo Flow   OTR/L Pager: [phone] Office: [phone] .

## 2015-08-28 NOTE — Discharge Summary (Signed)
Physician Discharge Summary  [name] MRN: [id number] DOB/AGE: 80-28-1920 80 y.o.  PCP: Donnie Coffin, MD   Admit date: 08/26/2015 Discharge date: 08/28/2015  Discharge Diagnoses:     Active Problems:   Dyspnea   Fatigue   CHF (congestive heart failure) (HCC)  severe tricuspid regurgitation Moderate aortic regurgitation   Follow-up recommendations Follow-up with PCP in 3-5 days , including all  additional recommended appointments as below Follow-up CBC, CMP in 3-5 days Patient to follow-up with cardiology for history of atrial fibrillation, acute on chronic diastolic heart failure, moderate aortic regurgitation, severe tricuspid regurgitation, pulmonary hypertension,     Medication List    STOP taking these medications        TAZTIA XT 300 MG 24 hr capsule  Generic drug:  diltiazem      TAKE these medications        CALCIUM-VITAMIN D PO  Take 1 tablet by mouth daily.     CHOLECALCIFEROL PO  Take 1 tablet by mouth daily.     COQ10 PO  Take 1 capsule by mouth daily.     diltiazem 360 MG 24 hr tablet  Commonly known as:  CARDIZEM LA  TAKE ONE TABLET BY MOUTH ONCE DAILY     FISH OIL PO  Take 1 capsule by mouth 2 (two) times daily.     furosemide 40 MG tablet  Commonly known as:  LASIX  TAKE ONE TABLET BY MOUTH ONCE DAILY     KLOR-CON M20 20 MEQ tablet  Generic drug:  potassium chloride SA  TAKE ONE TABLET BY MOUTH ONCE DAILY     losartan 25 MG tablet  Commonly known as:  COZAAR  Take 1 tablet (25 mg total) by mouth daily.     LYRICA 50 MG capsule  Generic drug:  pregabalin  Take 2 capsules by mouth 2 (two) times daily.     MAGNESIUM PO  Take 1 tablet by mouth 2 (two) times daily.     medroxyPROGESTERone 10 MG tablet  Commonly known as:  PROVERA  Take 1 tablet by mouth daily.     VITAMIN A PO  Take 1 capsule by mouth daily.     VITAMIN C PO  Take 1 tablet by mouth daily.     VITAMIN E PO  Take 1 capsule by mouth daily.     warfarin 2.5 MG tablet  Commonly known as:  COUMADIN  TAKE AS DIRECTED BY  COUMADIN  CLINIC         Discharge Condition:    Discharge Instructions         Disposition:    Consults: None    Significant Diagnostic Studies:  Dg Chest 2 View  08/26/2015  CLINICAL DATA:  Shortness of breath since Christmas. EXAM: CHEST  2 VIEW COMPARISON:  05/21/2012 FINDINGS: The heart is enlarged but stable. The pacer wires are stable. There is tortuosity and calcification of the thoracic aorta. The lungs demonstrate severe chronic bronchitic and emphysematous changes but no acute pulmonary infiltrates, effusions or edema. No acute bony findings. Stable remote mid thoracic compression deformity. IMPRESSION: Chronic emphysematous and bronchitic lung changes without definite acute overlying pulmonary process. Electronically Signed   By: Marijo Sanes M.D.   On: 08/26/2015 13:36     Echo Left ventricle: The cavity size was normal. There was mild focal basal hypertrophy of the septum. Systolic function was normal. The estimated ejection fraction was in the range of 60% to 65%. Wall motion was normal; there  were no regional wall motion abnormalities. - Aortic valve: Valve mobility was restricted. There was moderate regurgitation. Valve area (VTI): 1.54 cm^2. Valve area (Vmax): 1.55 cm^2. Valve area (Vmean): 1.55 cm^2. - Mitral valve: Calcified annulus. There was mild regurgitation. - Left atrium: The atrium was moderately dilated. - Right ventricle: The cavity size was mildly dilated. Wall thickness was normal. Systolic function was moderately reduced. - Right atrium: The atrium was severely dilated. - Atrial septum: No defect or patent foramen ovale was identified by color flow Doppler. - Tricuspid valve: There was moderate-severe regurgitation. - Pulmonic valve: There was moderate regurgitation. - Pulmonary arteries: Systolic pressure was severely increased. PA peak  pressure: 69 mm Hg (S). - Line: A venous catheter was visualized in the superior vena cava, with its tip in the right atrium. No abnormal features noted. - Inferior vena cava: The vessel was normal in size. The respirophasic diameter changes were in the normal range (>= 50%), consistent with normal central venous pressure.   Filed Weights   08/26/15 2135 08/27/15 0536 08/28/15 0549  Weight: 62.642 kg (138 lb 1.6 oz) 60.419 kg (133 lb 3.2 oz) 57.834 kg (127 lb 8 oz)     Microbiology: No results found for this or any previous visit (from the past 240 hour(s)).     Blood Culture No results found for: SDES, SPECREQUEST, CULT, REPTSTATUS    Labs: Results for orders placed or performed during the hospital encounter of 08/26/15 (from the past 48 hour(s))  Basic metabolic panel     Status: Abnormal   Collection Time: 08/26/15 12:53 PM  Result Value Ref Range   Sodium 137 135 - 145 mmol/L   Potassium 4.5 3.5 - 5.1 mmol/L   Chloride 101 101 - 111 mmol/L   CO2 24 22 - 32 mmol/L   Glucose, Bld 117 (H) 65 - 99 mg/dL   BUN 26 (H) 6 - 20 mg/dL   Creatinine, Ser 0.82 0.44 - 1.00 mg/dL   Calcium 10.1 8.9 - 10.3 mg/dL   GFR calc non Af Amer 59 (L) >60 mL/min   GFR calc Af Amer >60 >60 mL/min    Comment: (NOTE) The eGFR has been calculated using the CKD EPI equation. This calculation has not been validated in all clinical situations. eGFR's persistently <60 mL/min signify possible Chronic Kidney Disease.    Anion gap 12 5 - 15  CBC     Status: Abnormal   Collection Time: 08/26/15 12:53 PM  Result Value Ref Range   WBC 9.2 4.0 - 10.5 K/uL   RBC 4.64 3.87 - 5.11 MIL/uL   Hemoglobin 14.7 12.0 - 15.0 g/dL   HCT 42.2 36.0 - 46.0 %   MCV 90.9 78.0 - 100.0 fL   MCH 31.7 26.0 - 34.0 pg   MCHC 34.8 30.0 - 36.0 g/dL   RDW 15.9 (H) 11.5 - 15.5 %   Platelets 169 150 - 400 K/uL  I-stat troponin, ED (not at [facility])     Status: None   Collection Time: 08/26/15 12:56 PM  Result  Value Ref Range   Troponin i, poc 0.03 0.00 - 0.08 ng/mL   Comment 3            Comment: Due to the release kinetics of cTnI, a negative result within the first hours of the onset of symptoms does not rule out myocardial infarction with certainty. If myocardial infarction is still suspected, repeat the test at appropriate intervals.   Brain natriuretic peptide  Status: Abnormal   Collection Time: 08/26/15  6:00 PM  Result Value Ref Range   B Natriuretic Peptide 215.4 (H) 0.0 - 100.0 pg/mL  Protime-INR     Status: Abnormal   Collection Time: 08/26/15  6:00 PM  Result Value Ref Range   Prothrombin Time 28.3 (H) 11.6 - 15.2 seconds   INR 2.70 (H) 0.00 - 2.35  Basic metabolic panel     Status: Abnormal   Collection Time: 08/27/15  4:05 AM  Result Value Ref Range   Sodium 139 135 - 145 mmol/L   Potassium 4.0 3.5 - 5.1 mmol/L   Chloride 102 101 - 111 mmol/L   CO2 26 22 - 32 mmol/L   Glucose, Bld 90 65 - 99 mg/dL   BUN 27 (H) 6 - 20 mg/dL   Creatinine, Ser 0.87 0.44 - 1.00 mg/dL   Calcium 8.7 (L) 8.9 - 10.3 mg/dL   GFR calc non Af Amer 55 (L) >60 mL/min   GFR calc Af Amer >60 >60 mL/min    Comment: (NOTE) The eGFR has been calculated using the CKD EPI equation. This calculation has not been validated in all clinical situations. eGFR's persistently <60 mL/min signify possible Chronic Kidney Disease.    Anion gap 11 5 - 15  TSH     Status: None   Collection Time: 08/27/15  4:05 AM  Result Value Ref Range   TSH 1.344 0.350 - 4.500 uIU/mL  Urinalysis, Routine w reflex microscopic (not at [facility])     Status: Abnormal   Collection Time: 08/27/15  1:52 PM  Result Value Ref Range   Color, Urine YELLOW YELLOW   APPearance CLEAR CLEAR   Specific Gravity, Urine 1.005 1.005 - 1.030   pH 7.0 5.0 - 8.0   Glucose, UA NEGATIVE NEGATIVE mg/dL   Hgb urine dipstick TRACE (A) NEGATIVE   Bilirubin Urine NEGATIVE NEGATIVE   Ketones, ur NEGATIVE NEGATIVE mg/dL   Protein, ur NEGATIVE NEGATIVE  mg/dL   Nitrite NEGATIVE NEGATIVE   Leukocytes, UA NEGATIVE NEGATIVE  Urine microscopic-add on     Status: Abnormal   Collection Time: 08/27/15  1:52 PM  Result Value Ref Range   Squamous Epithelial / LPF 0-5 (A) NONE SEEN   WBC, UA NONE SEEN 0 - 5 WBC/hpf   RBC / HPF 0-5 0 - 5 RBC/hpf   Bacteria, UA NONE SEEN NONE SEEN  CBC     Status: Abnormal   Collection Time: 08/28/15  7:40 AM  Result Value Ref Range   WBC 6.1 4.0 - 10.5 K/uL   RBC 4.31 3.87 - 5.11 MIL/uL   Hemoglobin 13.5 12.0 - 15.0 g/dL   HCT 39.6 36.0 - 46.0 %   MCV 91.9 78.0 - 100.0 fL   MCH 31.3 26.0 - 34.0 pg   MCHC 34.1 30.0 - 36.0 g/dL   RDW 15.7 (H) 11.5 - 15.5 %   Platelets 159 150 - 400 K/uL  Comprehensive metabolic panel     Status: Abnormal   Collection Time: 08/28/15  7:40 AM  Result Value Ref Range   Sodium 141 135 - 145 mmol/L   Potassium 4.0 3.5 - 5.1 mmol/L   Chloride 104 101 - 111 mmol/L   CO2 29 22 - 32 mmol/L   Glucose, Bld 87 65 - 99 mg/dL   BUN 23 (H) 6 - 20 mg/dL   Creatinine, Ser 0.78 0.44 - 1.00 mg/dL   Calcium 8.6 (L) 8.9 - 10.3 mg/dL   Total Protein  6.3 (L) 6.5 - 8.1 g/dL   Albumin 3.1 (L) 3.5 - 5.0 g/dL   AST 27 15 - 41 U/L   ALT 26 14 - 54 U/L   Alkaline Phosphatase 53 38 - 126 U/L   Total Bilirubin 0.7 0.3 - 1.2 mg/dL   GFR calc non Af Amer >60 >60 mL/min   GFR calc Af Amer >60 >60 mL/min    Comment: (NOTE) The eGFR has been calculated using the CKD EPI equation. This calculation has not been validated in all clinical situations. eGFR's persistently <60 mL/min signify possible Chronic Kidney Disease.    Anion gap 8 5 - 15     Lipid Panel  No results found for: CHOL, TRIG, HDL, CHOLHDL, VLDL, LDLCALC, LDLDIRECT   No results found for: HGBA1C   Lab Results  Component Value Date   CREATININE 0.78 08/28/2015     Brief summary 82 yo pleasant female with history of Afib and HTN who came here with cc of fatigue. She said she has not been feeling well since Christmas but  this has gotten worse last week. She said she is a little short of breath but denied any orthopnea, PND, chest pain, , cough, N/V/D/C/abd pain, fever/chills. She has chronic knee pain with R>L lower leg swelling that may have gotten a little worse over the last couple of weeks. She said she is complaint with her meds. She still can do her daily activities inside her house.   Assessment and plan Acute on chronic hypoxemic respiratory failure Chest x-ray shows chronic emphysema, no definite pneumonia,    2-D echo with results as above ,  currently on 3 L of oxygen,   Patient currently on Lasix 40 mg PO daily, continue telemetry, continue to cycle cardiac enzymes Doubt pulmonary embolism event INR which was therapeutic upon admission Followed by Dr Irish Lack every 6 months , recommend patient continue to follow-up with him Reduced dose of Cozaar in the setting of soft blood pressure Patient will be set up with home oxygen and she was 87% with ambulation on room air  Atrial fibrillation Rate controlled on Cardizem, continue Coumadin per pharmacy, last INR 2.7 Intolerant to metoprolol in the past No recent history of falls  Hypertension -continue Cardizem, reduced dose of Cozaar  Anxiety -continue Klonopin, Lyrica   Dysphagia-SLP evaluation     Discharge Exam:    Blood pressure 98/35, pulse 70, temperature 97.7 F (36.5 C), temperature source Oral, resp. rate 20, height 5' 3"  (1.6 m), weight 57.834 kg (127 lb 8 oz), SpO2 98 %.   GEN: Well nourished, well developed, in no acute distress  HEENT: normal  Neck: no JVD, carotid bruits, or masses Cardiac: Irregularly irregular; no murmurs, rubs, or gallops,no edema  Respiratory: clear to auscultation bilaterally, normal work of breathing GI: soft, nontender, nondistended, + BS MS: no deformity or atrophy  Skin: warm and dry, no rash, shins are discolored-chronic Neuro: Strength and sensation are intact Psych: euthymic mood, full  affect       Follow-up Information    Follow up with Donnie Coffin, MD. Schedule an appointment as soon as possible for a visit in 3 days.   Specialty:  Family Medicine   Contact information:   [address] [place] [address] [phone]       Follow up with [facility] cardiology. Schedule an appointment as soon as possible for a visit in 1 week.   Why:  Follow-up of recent admission for  diastolic heart failure   Contact information:   [address] [place] [address] [phone]      Signed: Reyne Dumas 08/28/2015, 9:50 AM        Time spent >45 mins

## 2015-08-28 NOTE — Evaluation (Addendum)
Clinical/Bedside Swallow Evaluation Patient Details  Name: Stacy ZAVALETA MRN: [id number] Date of Birth: [birth year]  Today's Date: 08/28/2015 Time: SLP Start Time (ACUTE ONLY): 1345 SLP Stop Time (ACUTE ONLY): 1409 SLP Time Calculation (min) (ACUTE ONLY): 24 min  Past Medical History:  Past Medical History  Diagnosis Date  . Atrial fibrillation (HCC)   . HTN (hypertension)   . Anxiety   . GERD (gastroesophageal reflux disease)   . Insomnia   . Coronary atherosclerosis of native coronary artery   . Dysphagia   . CAD (coronary artery disease)   . Burning mouth syndrome    Past Surgical History:  Past Surgical History  Procedure Laterality Date  . Tonsillectomy    . Breast lumpectomy    . Kidney stone surgery     HPI:  82 yo pleasant female with history of Afib and HTN who came here with cc of fatigue. She said she has not been feeling well since Christmas but this has gotten worse last week. She said she is a little short of breath but denied any orthopnea, PND, chest pain, , cough, N/V/D/C/abd pain, fever/chills. She has chronic knee pain with R>L lower leg swelling that may have gotten a little worse over the last couple of weeks. She said she is complaint with her meds. She still can do her daily activities inside her house; pt is currently on 3L of Oxygen and will be discharged home with oxygen. 08/28/15 CXR indicated Findings are concerning for bronchopneumonia in the right middle lobe. Followup PA and lateral chest X-ray is recommended in 3-4 weeks following trial of antibiotic therapy to ensure resolution and exclude underlying malignancy  Assessment / Plan / Recommendation Clinical Impression   Pt did not exhibit any overt s/s of aspiration with thin-->solids and oropharyngeal swallow appears normal as timely swallow observed and no difficulty with any phase of the swallow observed during BSE.  No ST recommended at this time. MBS dated 9/14 indicated a normal study with  suspected primary esophageal dysphagia.    Aspiration Risk  No limitations    Diet Recommendation   Regular/thin  Medication Administration: Other (Comment) as tolerated    Other  Recommendations Oral Care Recommendations: Oral care BID   Follow up Recommendations  None    Frequency and Duration   n/a         Prognosis   Good     Swallow Study   General Date of Onset: 08/26/15 HPI: 82 yo pleasant female with history of Afib and HTN who came here with cc of fatigue. She said she has not been feeling well since Christmas but this has gotten worse last week. She said she is a little short of breath but denied any orthopnea, PND, chest pain, , cough, N/V/D/C/abd pain, fever/chills. She has chronic knee pain with R>L lower leg swelling that may have gotten a little worse over the last couple of weeks. She said she is complaint with her meds. She still can do her daily activities inside her house.  Type of Study: Bedside Swallow Evaluation Diet Prior to this Study: Regular;Thin liquids Temperature Spikes Noted: No Respiratory Status: Nasal cannula History of Recent Intubation: No Behavior/Cognition: Alert;Cooperative;Pleasant mood Oral Cavity Assessment: Within Functional Limits Oral Care Completed by SLP: Other (Comment) (Pt eating lunch tray when SLP arrived) Oral Cavity - Dentition: Adequate natural dentition Vision: Functional for self-feeding Self-Feeding Abilities: Able to feed self Patient Positioning: Upright in bed Baseline Vocal Quality: Hoarse Volitional Cough: Strong Volitional  Swallow: Able to elicit    Oral/Motor/Sensory Function Overall Oral Motor/Sensory Function: Within functional limits   Ice Chips Ice chips: Not tested   Thin Liquid Thin Liquid: Within functional limits Presentation: Cup    Nectar Thick Nectar Thick Liquid: Not tested   Honey Thick Honey Thick Liquid: Not tested   Puree Puree: Within functional limits Presentation: Spoon   Solid       Solid: Within functional limits Presentation: Self Fed        ADAMS,PAT, M.S., CCC-SLP 08/28/2015,2:27 PM

## 2015-08-28 NOTE — Discharge Instructions (Signed)

## 2015-08-28 NOTE — Evaluation (Signed)
Physical Therapy Evaluation Patient Details Name: [name] MRN: [id number] DOB: Apr 01, 1919 Today's Date: 08/28/2015   History of Present Illness  82 yo female admitted with acute on chronic hypoxemic respiratory failure  PMH: Afib, Htn,   Clinical Impression  Patient received mobilizing independently in room of 2 liters O2 9up at sink brushing her teeth and then walked back to bed to sit down independently upon entering room). Patient agreeable to evaluation, ambulated in hall on room air with desaturations to 97% on room air with difficulty rebounding 89% despite 1 minutes rest and pursed lip breathing. Reapplied 2 liters [place], and patient remained 95% on supplemental O2 with activity. At this time, patient is close to baseline, educated on energy conservation. No further acute PT needs. Encourage continued mobility during hospital stay.    Follow Up Recommendations No PT follow up    Equipment Recommendations  None recommended by PT    Recommendations for Other Services       Precautions / Restrictions Precautions Precautions: Fall Precaution Comments: oxygen       Mobility  Bed Mobility Overal bed mobility: Modified Independent                Transfers Overall transfer level: Needs assistance   Transfers: Sit to/from Stand Sit to Stand: Independent         General transfer comment: no physical assist required, patient was in the processed of standing at the sink brushing her teeth independently upon entering the room.   Ambulation/Gait Ambulation/Gait assistance: Independent Ambulation Distance (Feet): 160 Feet Assistive device: None Gait Pattern/deviations: Step-through pattern     General Gait Details: modest instability, no physical assist required desaturates with minimal ambulation to 87% with short distances. Attempted pursed lip breathing and rest to rebound, patient to 89% after 1 minutes, replaced 2 liters O2 and rebounded to 95%   Stairs             Wheelchair Mobility    Modified Rankin (Stroke Patients Only)       Balance Overall balance assessment: Modified Independent (modest deficits but patient employs strategies for mobility)                                           Pertinent Vitals/Pain Pain Assessment: No/denies pain    Home Living Family/patient expects to be discharged to:: Private residence Living Arrangements: Spouse/significant other Available Help at Discharge: Family;Available 24 hours/day Type of Home: House Home Access: Stairs to enter Entrance Stairs-Rails: Right;Can reach both Secretary/administrator of Steps: 4 Home Layout: Multi-level Home Equipment: Shower seat;Grab bars - tub/shower Additional Comments: drivers license    Prior Function Level of Independence: Independent               Hand Dominance   Dominant Hand: Right    Extremity/Trunk Assessment   Upper Extremity Assessment: Generalized weakness           Lower Extremity Assessment: Defer to PT evaluation      Cervical / Trunk Assessment: Kyphotic  Communication   Communication: No difficulties  Cognition Arousal/Alertness: Awake/alert Behavior During Therapy: WFL for tasks assessed/performed Overall Cognitive Status: Within Functional Limits for tasks assessed                      General Comments      Exercises  Assessment/Plan    PT Assessment Patent does not need any further PT services  PT Diagnosis Difficulty walking   PT Problem List    PT Treatment Interventions     PT Goals (Current goals can be found in the Care Plan section) Acute Rehab PT Goals Patient Stated Goal: to return to church PT Goal Formulation: All assessment and education complete, DC therapy    Frequency     Barriers to discharge        Co-evaluation               End of Session Equipment Utilized During Treatment: Oxygen Activity Tolerance: Patient tolerated  treatment well   Nurse Communication: Mobility status         Time: 0943-1000 PT Time Calculation (min) (ACUTE ONLY): 17 min   Charges:   PT Evaluation $PT Eval Low Complexity: 1 Procedure     PT G CodesFabio Asa 2015/09/13, 10:10 AM Charlotte Crumb, PT DPT  [phone]

## 2015-08-28 NOTE — Progress Notes (Signed)
SATURATION QUALIFICATIONS: (This note is used to comply with regulatory documentation for home oxygen)  Patient Saturations on Room Air at Rest = 97%  Patient Saturations on Room Air while Ambulating = 87%   Patient Saturations on 2 Liters of oxygen while Ambulating = 95%  Please briefly explain why patient needs home oxygen: Patient with desaturations to 87% on room air with minimal ambulation, attempted rest and breathing patient rebounded to 89% with 1 minutes rest, improved with supplemental O2 to 95%    Charlotte Crumb, PT DPT  [phone]

## 2015-08-28 NOTE — Telephone Encounter (Signed)
Pt discharged from [facility] today 08/28/15.  TOC appt scheduled with Dr Ladona Ridgel for 09/04/15.  Triage to call for TOC follow-up on 08/29/15.

## 2015-08-28 NOTE — Care Management Note (Signed)
Case Management Note  Patient Details  Name: [name] MRN: [id number] Date of Birth: 1919-03-06  Subjective/Objective:      Date:  08/28/15 Spoke with patient at the bedside.  Introduced self as Sports coach and explained role in discharge planning and how to be reached.  Verified patient lives in town, alone with spouse. Expressed potential need for home oxygen with [facility], referral made to [facility] with [facility]  And he wll bring up to patient's room, she is for dc today. Verified patient anticipates to go home with family,  full-time  supervision by family at this time to best of their knowledge.  Patient  denied needing help with their medication.  Patient is driven by spouse to MD appointments.  Verified patient has PCP [name].    Plan: CM will continue to follow for discharge planning and [facility] resources.               Action/Plan:   Expected Discharge Date:                  Expected Discharge Plan:  Home/Self Care  In-House Referral:     Discharge planning Services  CM Consult  Post Acute Care Choice:  Durable Medical Equipment Choice offered to:  Patient  DME Arranged:  Oxygen DME Agency:  [facility]  HH Arranged:    HH Agency:     Status of Service:  Completed, signed off  Medicare Important Message Given:    Date Medicare IM Given:    Medicare IM give by:    Date Additional Medicare IM Given:    Additional Medicare Important Message give by:     If discussed at Long Length of Stay Meetings, dates discussed:    Additional Comments:  Leone Haven, RN 08/28/2015, 2:16 PM

## 2015-08-28 NOTE — Progress Notes (Signed)
Utilization review completed. Sarra Rachels, RN, BSN. 

## 2015-08-28 NOTE — Progress Notes (Signed)
ANTICOAGULATION CONSULT NOTE - Initial Consult  Pharmacy Consult:  Coumadin Indication: atrial fibrillation  Allergies  Allergen Reactions  . Cortisone Other (See Comments)    REACTION: "STOMACH BURNS"  . Metoprolol Diarrhea    Patient Measurements: Height:  (160 cm) Weight: 127 lb 8 oz (57.834 kg) IBW/kg (Calculated) : 52.4  Vital Signs: Temp: 97.3 F (36.3 C) (01/24 1210) Temp Source: Oral (01/24 1210) BP: 119/61 mmHg (01/24 1210) Pulse Rate: 82 (01/24 1210)  Labs:  Recent Labs  08/26/15 1253 08/26/15 1800 08/27/15 0405 08/28/15 0740 08/28/15 1026  HGB 14.7  --   --  13.5  --   HCT 42.2  --   --  39.6  --   PLT 169  --   --  159  --   LABPROT  --  28.3*  --   --  20.5*  INR  --  2.70*  --   --  1.76*  CREATININE 0.82  --  0.87 0.78  --   TROPONINI  --   --   --   --  0.04*    Estimated Creatinine Clearance: 35.6 mL/min (by C-G formula based on Cr of 0.78).   Medical History: Past Medical History  Diagnosis Date  . Atrial fibrillation (HCC)   . HTN (hypertension)   . Anxiety   . GERD (gastroesophageal reflux disease)   . Insomnia   . Coronary atherosclerosis of native coronary artery   . Dysphagia   . CAD (coronary artery disease)   . Burning mouth syndrome      Assessment: 46 YOF with history of Afib to continue on Coumadin.  Patient missed her dose on 08/26/15 and INR is sub-therapeutic today.  No bleeding reported.  Home dose: 3.75mg  daily except  on Tues   Goal of Therapy:  INR 2-3    Plan:  - Coumadin  PO today if still here - Daily PT / INR   Christin Mccreedy D. Laney Potash, PharmD, BCPS Pager:  [phone] 08/28/2015, 1:55 PM

## 2015-08-30 ENCOUNTER — Telehealth: Payer: Self-pay | Admitting: Interventional Cardiology

## 2015-08-30 NOTE — Telephone Encounter (Signed)
Patient contacted regarding discharge from [facility] on 08/28/15.  Patient understands to follow up with provider Dr. Ladona Ridgel on 09/04/15 at 1115 at [facility]. Location. Patient understands discharge instructions? yes Patient understands medications and regiment? yes Patient understands to bring all medications to this visit? yes  Pt has no further questions or complaints at this time.  Pt states she is feeling much better since being discharged from the hospital.  Pt gracious for all the assistance and follow-up.

## 2015-08-30 NOTE — Telephone Encounter (Signed)
I spoke with the pt and she did not realize that it was our office who called her this morning to do a TCM call.  The pt appreciated my return call.

## 2015-08-30 NOTE — Telephone Encounter (Signed)
New problem    Pt stated she has been in the hospital and wanted to let Dr Eldridge Dace know.

## 2015-08-31 ENCOUNTER — Telehealth: Payer: Self-pay | Admitting: Interventional Cardiology

## 2015-08-31 NOTE — Telephone Encounter (Signed)
New Message  Pt wanted to know if she can return to using her exercise bicycle. Please call back and discuss.

## 2015-08-31 NOTE — Telephone Encounter (Signed)
I spoke with the pt and she wanted to know if she can use her exercise bicycle at home.  The pt was started on oxygen during her hospitalization and she cannot get her O2 tank down stairs where her exercise bicycle is located. I advised her to hold off on using her exercise bike since she cannot use her oxygen during exercise.  Pt agreed with plan.

## 2015-09-04 ENCOUNTER — Encounter: Payer: Self-pay | Admitting: Internal Medicine

## 2015-09-04 ENCOUNTER — Ambulatory Visit (INDEPENDENT_AMBULATORY_CARE_PROVIDER_SITE_OTHER): Payer: Medicare Other | Admitting: Internal Medicine

## 2015-09-04 ENCOUNTER — Ambulatory Visit (INDEPENDENT_AMBULATORY_CARE_PROVIDER_SITE_OTHER): Payer: Medicare Other

## 2015-09-04 VITALS — BP 130/62 | HR 98 | Ht 63.0 in | Wt 130.6 lb

## 2015-09-04 DIAGNOSIS — Z45018 Encounter for adjustment and management of other part of cardiac pacemaker: Secondary | ICD-10-CM

## 2015-09-04 DIAGNOSIS — I482 Chronic atrial fibrillation, unspecified: Secondary | ICD-10-CM

## 2015-09-04 DIAGNOSIS — Z5181 Encounter for therapeutic drug level monitoring: Secondary | ICD-10-CM

## 2015-09-04 DIAGNOSIS — Z950 Presence of cardiac pacemaker: Secondary | ICD-10-CM

## 2015-09-04 DIAGNOSIS — I10 Essential (primary) hypertension: Secondary | ICD-10-CM

## 2015-09-04 DIAGNOSIS — I5032 Chronic diastolic (congestive) heart failure: Secondary | ICD-10-CM | POA: Diagnosis not present

## 2015-09-04 DIAGNOSIS — I4891 Unspecified atrial fibrillation: Secondary | ICD-10-CM

## 2015-09-04 LAB — CUP PACEART INCLINIC DEVICE CHECK
Battery Impedance: 464 Ohm
Brady Statistic RV Percent Paced: 24 %
Implantable Lead Implant Date: 20111202
Implantable Lead Implant Date: 20111202
Implantable Lead Location: 753860
Implantable Lead Model: 5076
Lead Channel Impedance Value: 509 Ohm
Lead Channel Impedance Value: 67 Ohm
Lead Channel Pacing Threshold Amplitude: 0.5 V
Lead Channel Pacing Threshold Pulse Width: 0.4 ms
Lead Channel Setting Pacing Amplitude: 2.5 V
MDC IDC LEAD LOCATION: 753859
MDC IDC MSMT BATTERY REMAINING LONGEVITY: 95 mo
MDC IDC MSMT BATTERY VOLTAGE: 2.78 V
MDC IDC MSMT LEADCHNL RV PACING THRESHOLD AMPLITUDE: 0.625 V
MDC IDC MSMT LEADCHNL RV PACING THRESHOLD PULSEWIDTH: 0.4 ms
MDC IDC MSMT LEADCHNL RV SENSING INTR AMPL: 11.2 mV
MDC IDC SESS DTM: 20170131120239
MDC IDC SET LEADCHNL RV PACING PULSEWIDTH: 0.4 ms
MDC IDC SET LEADCHNL RV SENSING SENSITIVITY: 4 mV

## 2015-09-04 LAB — POCT INR: INR: 2

## 2015-09-04 NOTE — Assessment & Plan Note (Signed)
Her VR is increased today as she is quite excited and talked a lot. She notes that her HR is in the 70-90 range at home. No change in her meds.

## 2015-09-04 NOTE — Assessment & Plan Note (Signed)
Her blood pressure is well controlled. She will continue her current meds.  

## 2015-09-04 NOTE — Patient Instructions (Signed)
Medication Instructions:  Your physician recommends that you continue on your current medications as directed. Please refer to the Current Medication list given to you today.   Labwork: None ordered   Testing/Procedures: None ordered   Follow-Up: Your physician wants you to follow-up in: 12 months with Dr Taylor You will receive a reminder letter in the mail two months in advance. If you don't receive a letter, please call our office to schedule the follow-up appointment.  Remote monitoring is used to monitor your Pacemaker  from home. This monitoring reduces the number of office visits required to check your device to one time per year. It allows us to keep an eye on the functioning of your device to ensure it is working properly. You are scheduled for a device check from home on 12/04/15. You may send your transmission at any time that day. If you have a wireless device, the transmission will be sent automatically. After your physician reviews your transmission, you will receive a postcard with your next transmission date.     Any Other Special Instructions Will Be Listed Below (If Applicable).     If you need a refill on your cardiac medications before your next appointment, please call your pharmacy.   

## 2015-09-04 NOTE — Assessment & Plan Note (Signed)
Her symptoms are class 2 and she is not volume overloaded. Will follow.

## 2015-09-04 NOTE — Assessment & Plan Note (Signed)
Her medtronic pm is working normally. Will recheck in several months.

## 2015-09-04 NOTE — Progress Notes (Signed)
Patient ID: [name], female   DOB: [birth year], 82 y.o.   MRN: [id number]    [address] [place], [place]  [address] Phone: [phone] Fax:  [phone]  Date:  09/04/2015   ID:  [name], DOB [birth year], MRN [id number]  PCP:  [name], MD      History of Present Illness: [name] is a 82 y.o. female who has had AFib and HTN and symptomatic bradycardia, status post permanent pacemaker insertion. Her BP has been controlled.  She denies syncope, chest pain, or shortness of breath. She has done amazingly well despite her advanced age. No peripheral edema or palpitations. She notes that she was recently in the hospital for 2 days with fluid overload. She has been watching her weight.   Wt Readings from Last 3 Encounters:  08/28/15 127 lb 8 oz (57.834 kg)  01/29/15 129 lb 9.6 oz (58.786 kg)  08/31/14 130 lb 12.8 oz (59.33 kg)     Past Medical History  Diagnosis Date  . Atrial fibrillation (HCC)   . HTN (hypertension)   . Anxiety   . GERD (gastroesophageal reflux disease)   . Insomnia   . Coronary atherosclerosis of native coronary artery   . Dysphagia   . CAD (coronary artery disease)   . Burning mouth syndrome     Current Outpatient Prescriptions  Medication Sig Dispense Refill  . Ascorbic Acid (VITAMIN C PO) Take 1 tablet by mouth daily.    Marland Kitchen CALCIUM-VITAMIN D PO Take 1 tablet by mouth daily.    . CHOLECALCIFEROL PO Take 1 tablet by mouth daily.    . clonazePAM (KLONOPIN) 0.5 MG tablet Take 0.5 mg by mouth daily.    . Coenzyme Q10 (COQ10 PO) Take 1 capsule by mouth daily.    Marland Kitchen diltiazem (CARDIZEM LA) 360 MG 24 hr tablet TAKE ONE TABLET BY MOUTH ONCE DAILY 30 tablet 0  . furosemide (LASIX) 40 MG tablet TAKE ONE TABLET BY MOUTH ONCE DAILY 30 tablet 4  . KLOR-CON M20 20 MEQ tablet TAKE ONE TABLET BY MOUTH ONCE DAILY 90 tablet 1  . losartan (COZAAR) 25 MG tablet Take 1 tablet (25 mg total) by mouth daily. 30 tablet 1  . LYRICA 50 MG  capsule Take 2 capsules by mouth 2 (two) times daily.     Marland Kitchen MAGNESIUM PO Take 1 tablet by mouth 2 (two) times daily.    . medroxyPROGESTERone (PROVERA) 10 MG tablet Take 1 tablet by mouth daily.    . Omega-3 Fatty Acids (FISH OIL PO) Take 1 capsule by mouth 2 (two) times daily.    Marland Kitchen PREMARIN 0.625 MG tablet Take 0.625 mg by mouth daily.    Marland Kitchen TAZTIA XT 300 MG 24 hr capsule Take 300 mg by mouth daily.    Marland Kitchen VITAMIN A PO Take 1 capsule by mouth daily.    Marland Kitchen VITAMIN E PO Take 1 capsule by mouth daily.    Marland Kitchen warfarin (COUMADIN) 2.5 MG tablet TAKE AS DIRECTED BY  COUMADIN  CLINIC (Patient taking differently: TAKES 3.75MG  ALL DAYS OF WEEK, TAKES  ON TUES ONLY IN EVENINGS) 50 tablet 3   No current facility-administered medications for this visit.    Allergies:    Allergies  Allergen Reactions  . Cortisone Other (See Comments)    REACTION: "STOMACH BURNS"  . Metoprolol Diarrhea    Social History:  The patient  reports that she has never smoked. She has never used smokeless  tobacco. She reports that she does not drink alcohol or use illicit drugs.   Family History:  The patient's family history includes Bladder Cancer in her brother, father, and paternal grandfather; Leukemia in her mother; Liver cancer in her sister.   ROS:  Please see the history of present illness.  No nausea, vomiting.  No fevers, chills.  No focal weakness.  No dysuria.   Alli other systems reviewed and negative.   PHYSICAL EXAM: VS: BP - 130/62, P- 98, r - 16, Wt. - 130   Well nourished, well developed, in no acute distress HEENT: normal Neck: 6 cm JVD, no carotid bruits Cardiac:  normal S1, S2; irregularly irregular Lungs:  clear to auscultation bilaterally, no wheezing, rhonchi; rare basilar rales Abd: soft, nontender, no hepatomegaly Ext: no edema Skin: warm and dry Neuro:   no focal abnormalities noted   ASSESSMENT AND PLAN:  Atrial fibrillation  Continue Diltiazem HCl Coated Beads Tablet Extended Release  24 Hour, 300 MG, 1 capsule, Orally, Once a day, Notes: switches to 360 mg for tachycardia at times  Notes: Rate controlled.     2. Essential hypertension, benign  Continue Losartan Potassium Tablet, 100 MG, 1 tablet, Orally, once a day Notes: Controlled.    Preventive Medicine  Adult topics discussed:  Diet: healthy diet.  Exercise: 5 days a week,, at least 30 minutes of aerobic exercise.       Lewayne Bunting, M.D. 09/04/2015 11:57 AM

## 2015-09-05 DIAGNOSIS — I10 Essential (primary) hypertension: Secondary | ICD-10-CM | POA: Diagnosis not present

## 2015-09-05 DIAGNOSIS — I509 Heart failure, unspecified: Secondary | ICD-10-CM | POA: Diagnosis not present

## 2015-09-05 DIAGNOSIS — I4891 Unspecified atrial fibrillation: Secondary | ICD-10-CM | POA: Diagnosis not present

## 2015-09-10 ENCOUNTER — Other Ambulatory Visit: Payer: Self-pay | Admitting: Interventional Cardiology

## 2015-09-13 ENCOUNTER — Telehealth: Payer: Self-pay | Admitting: *Deleted

## 2015-09-13 NOTE — Telephone Encounter (Signed)
Per patient's office note she is taking one daily.  I would call her and ask how she is taking the medication.  If she is taking more than one daily will need to discuss with Dr Ladona Ridgel

## 2015-09-13 NOTE — Telephone Encounter (Signed)
Patient is calling about possibly changing her RX for lasix to 45 a month because she is running out of it before the end of the month. Please advise. Thank you

## 2015-09-19 NOTE — Progress Notes (Signed)
Patient ID: [name], female   DOB: [birth year], 82 y.o.   MRN: [id number]     Cardiology Office Note   Date:  09/20/2015   ID:  [name], DOB 1918-10-30, MRN [id number]  PCP:  [name], MD    No chief complaint on file.  Follow-up atrial fibrillation  Wt Readings from Last 3 Encounters:  09/20/15 133 lb (60.328 kg)  09/04/15 130 lb 9.6 oz (59.24 kg)  08/28/15 127 lb 8 oz (57.834 kg)       History of Present Illness: [name] is a 82 y.o. female  who has had AFib and HTN. Feels well. BP has been well controlled, around 120/70s. Tries exercise daily, no SHOB or chest pain.  Atrial Fibrillation F/U:  Denies : Chest pain.  Dizziness. .  Palpitations.   Syncope.  She was hospitalized for fluid overload. Ago. She spent about 2 days in the hospital. She was diuresed. She has been on a more consistent dose of Lasix. She weighs yourself daily. She minimizes salt. Overall, she has done very well since being in the hospital.      Past Medical History  Diagnosis Date  . Atrial fibrillation (HCC)   . HTN (hypertension)   . Anxiety   . GERD (gastroesophageal reflux disease)   . Insomnia   . Coronary atherosclerosis of native coronary artery   . Dysphagia   . CAD (coronary artery disease)   . Burning mouth syndrome     Past Surgical History  Procedure Laterality Date  . Tonsillectomy    . Breast lumpectomy    . Kidney stone surgery       Current Outpatient Prescriptions  Medication Sig Dispense Refill  . Ascorbic Acid (VITAMIN C PO) Take 1 tablet by mouth daily.    Marland Kitchen CALCIUM-VITAMIN D PO Take 1 tablet by mouth daily.    . CHOLECALCIFEROL PO Take 1 tablet by mouth daily.    . clonazePAM (KLONOPIN) 0.5 MG tablet Take 0.5 mg by mouth daily.    . Coenzyme Q10 (COQ10 PO) Take 1 capsule by mouth daily.    Marland Kitchen diltiazem (CARDIZEM LA) 360 MG 24 hr tablet TAKE ONE TABLET BY MOUTH ONCE DAILY 30 tablet 1  . losartan (COZAAR) 25 MG tablet Take 1 tablet  (25 mg total) by mouth daily. 30 tablet 1  . LYRICA 50 MG capsule Take 2 capsules by mouth 2 (two) times daily.     Marland Kitchen MAGNESIUM PO Take 1 tablet by mouth 2 (two) times daily.    . medroxyPROGESTERone (PROVERA) 10 MG tablet Take 1 tablet by mouth daily.    . Omega-3 Fatty Acids (FISH OIL PO) Take 1 capsule by mouth 2 (two) times daily.    Marland Kitchen PREMARIN 0.625 MG tablet Take 0.625 mg by mouth daily.    Marland Kitchen TAZTIA XT 300 MG 24 hr capsule Take 300 mg by mouth daily.    Marland Kitchen VITAMIN A PO Take 1 capsule by mouth daily.    Marland Kitchen VITAMIN E PO Take 1 capsule by mouth daily.    Marland Kitchen warfarin (COUMADIN) 2.5 MG tablet TAKE AS DIRECTED BY  COUMADIN  CLINIC (Patient taking differently: TAKES 3.75MG  ALL DAYS OF WEEK, TAKES  ON TUES ONLY IN EVENINGS) 50 tablet 3  . furosemide (LASIX) 40 MG tablet Take 1 tablet daily. May take 1 additional tablet daily for increased weight/SOB and edema as directed. 60 tablet 9  . KLOR-CON M20 20 MEQ tablet TAKE ONE TABLET BY  MOUTH ONCE DAILY (Patient not taking: Reported on 09/20/2015) 90 tablet 1   No current facility-administered medications for this visit.    Allergies:   Cortisone and Metoprolol    Social History:  The patient  reports that she has never smoked. She has never used smokeless tobacco. She reports that she does not drink alcohol or use illicit drugs.   Family History:  The patient's family history includes Bladder Cancer in her brother, father, and paternal grandfather; Hypertension in her father; Leukemia in her mother; Liver cancer in her sister. There is no history of Heart attack or Stroke.    ROS:  Please see the history of present illness.   Otherwise, review of systems are positive for recent fluid overload-improved.   All other systems are reviewed and negative.    PHYSICAL EXAM: VS:  BP 125/50 mmHg  Pulse 93  Ht 5' 1.5" (1.562 m)  Wt 133 lb (60.328 kg)  BMI 24.73 kg/m2 , BMI Body mass index is 24.73 kg/(m^2). GEN: Well nourished, well developed, in no  acute distress HEENT: normal Neck: no JVD, carotid bruits, or masses Cardiac: Irregularly irregular; no murmurs, rubs, or gallops,no edema  Respiratory:  clear to auscultation bilaterally, normal work of breathing GI: soft, nontender, nondistended, + BS MS: no deformity or atrophy Skin: warm and dry, no rash Neuro:  Strength and sensation are intact Psych: euthymic mood, full affect   EKG:   The ekg ordered today demonstrates AFib, rate controlled   Recent Labs: 08/26/2015: B Natriuretic Peptide 215.4* 08/27/2015: TSH 1.344 08/28/2015: ALT 26; BUN 23*; Creatinine, Ser 0.78; Hemoglobin 13.5; Platelets 159; Potassium 4.0; Sodium 141   Lipid Panel No results found for: CHOL, TRIG, HDL, CHOLHDL, VLDL, LDLCALC, LDLDIRECT   Other studies Reviewed: Additional studies/ records that were reviewed today with results demonstrating: normal LVEF in 1/17.   ASSESSMENT AND PLAN:  1. Atrial fibrillation: Rate controlled. Continue current medicines. She did not tolerate metoprolol in the past. We just adjust the diltiazem dose as needed, 300-360mg . Coumadin for stroke prevention. No bleeding issues. She has not had any falls recently. Continue anticoagulation. 2. Hypertension: Blood pressure well controlled. Continue losartan. 3. Edema/chronic diastolic heart failure: Managed with furosemide. Elevate legs when possible.  Minimize salt. Stressed the importance of daily weights. 4. Skin discoloration: This started when she took amiodarone. There is likely a component of venous insufficiency as well. Would not plan any further management of this issue. 5. Continue with routine pacemaker checks.   Current medicines are reviewed at length with the patient today.  The patient concerns regarding her medicines were addressed.  The following changes have been made:  No change  Labs/ tests ordered today include:   Orders Placed This Encounter  Procedures  . EKG 12-Lead    Recommend 150  minutes/week of aerobic exercise Low fat, low carb, high fiber diet recommended  Disposition:   FU in 9 months   Delorise Jackson., MD  09/20/2015 5:35 PM    [facility] [address] [place], [place], [place]  [address] Phone: [phone]; Fax: [phone]

## 2015-09-20 ENCOUNTER — Encounter: Payer: Self-pay | Admitting: Interventional Cardiology

## 2015-09-20 ENCOUNTER — Ambulatory Visit (INDEPENDENT_AMBULATORY_CARE_PROVIDER_SITE_OTHER): Payer: Medicare Other | Admitting: Interventional Cardiology

## 2015-09-20 VITALS — BP 125/50 | HR 93 | Ht 61.5 in | Wt 133.0 lb

## 2015-09-20 DIAGNOSIS — I10 Essential (primary) hypertension: Secondary | ICD-10-CM | POA: Diagnosis not present

## 2015-09-20 DIAGNOSIS — R600 Localized edema: Secondary | ICD-10-CM

## 2015-09-20 DIAGNOSIS — I482 Chronic atrial fibrillation, unspecified: Secondary | ICD-10-CM

## 2015-09-20 DIAGNOSIS — Z950 Presence of cardiac pacemaker: Secondary | ICD-10-CM

## 2015-09-20 DIAGNOSIS — I5032 Chronic diastolic (congestive) heart failure: Secondary | ICD-10-CM

## 2015-09-20 MED ORDER — FUROSEMIDE 40 MG PO TABS: ORAL_TABLET | ORAL | Status: DC

## 2015-09-20 NOTE — Patient Instructions (Signed)
Medication Instructions:  Same-no changes  Labwork: None  Testing/Procedures: None  Follow-Up: Your physician wants you to follow-up in: 9 months. You will receive a reminder letter in the mail two months in advance. If you don't receive a letter, please call our office to schedule the follow-up appointment.     If you need a refill on your cardiac medications before your next appointment, please call your pharmacy.   

## 2015-09-24 ENCOUNTER — Encounter: Payer: Self-pay | Admitting: Interventional Cardiology

## 2015-10-12 ENCOUNTER — Ambulatory Visit (INDEPENDENT_AMBULATORY_CARE_PROVIDER_SITE_OTHER): Payer: Medicare Other | Admitting: *Deleted

## 2015-10-12 DIAGNOSIS — I482 Chronic atrial fibrillation, unspecified: Secondary | ICD-10-CM

## 2015-10-12 DIAGNOSIS — I4891 Unspecified atrial fibrillation: Secondary | ICD-10-CM | POA: Diagnosis not present

## 2015-10-12 DIAGNOSIS — Z5181 Encounter for therapeutic drug level monitoring: Secondary | ICD-10-CM | POA: Diagnosis not present

## 2015-10-12 LAB — POCT INR: INR: 2

## 2015-10-15 ENCOUNTER — Other Ambulatory Visit: Payer: Self-pay | Admitting: Interventional Cardiology

## 2015-10-19 ENCOUNTER — Other Ambulatory Visit: Payer: Self-pay | Admitting: Interventional Cardiology

## 2015-10-30 ENCOUNTER — Other Ambulatory Visit: Payer: Self-pay | Admitting: Interventional Cardiology

## 2015-11-01 DIAGNOSIS — M1711 Unilateral primary osteoarthritis, right knee: Secondary | ICD-10-CM | POA: Diagnosis not present

## 2015-11-02 ENCOUNTER — Other Ambulatory Visit: Payer: Self-pay | Admitting: *Deleted

## 2015-11-02 MED ORDER — LOSARTAN POTASSIUM 25 MG PO TABS: 25.0000 mg | ORAL_TABLET | Freq: Every day | ORAL | Status: DC

## 2015-11-09 ENCOUNTER — Ambulatory Visit (INDEPENDENT_AMBULATORY_CARE_PROVIDER_SITE_OTHER): Payer: Medicare Other | Admitting: *Deleted

## 2015-11-09 DIAGNOSIS — I482 Chronic atrial fibrillation, unspecified: Secondary | ICD-10-CM

## 2015-11-09 DIAGNOSIS — Z5181 Encounter for therapeutic drug level monitoring: Secondary | ICD-10-CM | POA: Diagnosis not present

## 2015-11-09 DIAGNOSIS — I4891 Unspecified atrial fibrillation: Secondary | ICD-10-CM | POA: Diagnosis not present

## 2015-11-09 LAB — POCT INR: INR: 2.8

## 2015-12-04 ENCOUNTER — Encounter: Payer: Medicare Other | Admitting: *Deleted

## 2015-12-04 ENCOUNTER — Telehealth: Payer: Self-pay | Admitting: Cardiology

## 2015-12-04 NOTE — Telephone Encounter (Signed)
Spoke with pt and reminded pt of remote transmission that is due today. Pt verbalized understanding.   

## 2015-12-07 ENCOUNTER — Ambulatory Visit (INDEPENDENT_AMBULATORY_CARE_PROVIDER_SITE_OTHER): Payer: Medicare Other

## 2015-12-07 ENCOUNTER — Encounter: Payer: Self-pay | Admitting: Cardiology

## 2015-12-07 DIAGNOSIS — I482 Chronic atrial fibrillation, unspecified: Secondary | ICD-10-CM

## 2015-12-07 DIAGNOSIS — Z5181 Encounter for therapeutic drug level monitoring: Secondary | ICD-10-CM | POA: Diagnosis not present

## 2015-12-07 DIAGNOSIS — I4891 Unspecified atrial fibrillation: Secondary | ICD-10-CM | POA: Diagnosis not present

## 2015-12-07 LAB — POCT INR: INR: 2.6

## 2016-01-04 ENCOUNTER — Ambulatory Visit (INDEPENDENT_AMBULATORY_CARE_PROVIDER_SITE_OTHER): Payer: Medicare Other | Admitting: *Deleted

## 2016-01-04 DIAGNOSIS — Z5181 Encounter for therapeutic drug level monitoring: Secondary | ICD-10-CM

## 2016-01-04 DIAGNOSIS — I482 Chronic atrial fibrillation, unspecified: Secondary | ICD-10-CM

## 2016-01-04 DIAGNOSIS — I4891 Unspecified atrial fibrillation: Secondary | ICD-10-CM | POA: Diagnosis not present

## 2016-01-04 DIAGNOSIS — R001 Bradycardia, unspecified: Secondary | ICD-10-CM | POA: Diagnosis not present

## 2016-01-04 LAB — POCT INR: INR: 2.3

## 2016-01-04 NOTE — Progress Notes (Signed)
Remote pacemaker transmission.   

## 2016-01-09 ENCOUNTER — Encounter: Payer: Self-pay | Admitting: Cardiology

## 2016-01-17 LAB — CUP PACEART REMOTE DEVICE CHECK
Battery Impedance: 565 Ohm
Battery Remaining Longevity: 86 mo
Battery Voltage: 2.78 V
Implantable Lead Implant Date: 20111202
Implantable Lead Location: 753860
Implantable Lead Model: 5076
Implantable Lead Model: 5076
Lead Channel Impedance Value: 519 Ohm
Lead Channel Setting Pacing Pulse Width: 0.4 ms
MDC IDC LEAD IMPLANT DT: 20111202
MDC IDC LEAD LOCATION: 753859
MDC IDC MSMT LEADCHNL RA IMPEDANCE VALUE: 67 Ohm
MDC IDC SESS DTM: 20170602151757
MDC IDC SET LEADCHNL RV PACING AMPLITUDE: 2.5 V
MDC IDC SET LEADCHNL RV SENSING SENSITIVITY: 5.6 mV
MDC IDC STAT BRADY RV PERCENT PACED: 28 %

## 2016-01-23 ENCOUNTER — Encounter: Payer: Self-pay | Admitting: Cardiology

## 2016-01-28 ENCOUNTER — Other Ambulatory Visit: Payer: Self-pay | Admitting: Interventional Cardiology

## 2016-01-29 ENCOUNTER — Other Ambulatory Visit: Payer: Self-pay | Admitting: *Deleted

## 2016-01-29 DIAGNOSIS — F419 Anxiety disorder, unspecified: Secondary | ICD-10-CM | POA: Diagnosis not present

## 2016-01-29 DIAGNOSIS — I10 Essential (primary) hypertension: Secondary | ICD-10-CM | POA: Diagnosis not present

## 2016-01-29 DIAGNOSIS — K146 Glossodynia: Secondary | ICD-10-CM | POA: Diagnosis not present

## 2016-01-29 DIAGNOSIS — Z780 Asymptomatic menopausal state: Secondary | ICD-10-CM | POA: Diagnosis not present

## 2016-01-29 DIAGNOSIS — I4891 Unspecified atrial fibrillation: Secondary | ICD-10-CM | POA: Diagnosis not present

## 2016-01-29 MED ORDER — POTASSIUM CHLORIDE CRYS ER 20 MEQ PO TBCR
20.0000 meq | EXTENDED_RELEASE_TABLET | Freq: Every day | ORAL | Status: DC
Start: 2016-01-29 — End: 2016-12-23

## 2016-02-01 ENCOUNTER — Ambulatory Visit (INDEPENDENT_AMBULATORY_CARE_PROVIDER_SITE_OTHER): Payer: Medicare Other

## 2016-02-01 ENCOUNTER — Encounter: Payer: Self-pay | Admitting: Interventional Cardiology

## 2016-02-01 DIAGNOSIS — I482 Chronic atrial fibrillation, unspecified: Secondary | ICD-10-CM

## 2016-02-01 DIAGNOSIS — I4891 Unspecified atrial fibrillation: Secondary | ICD-10-CM

## 2016-02-01 DIAGNOSIS — Z5181 Encounter for therapeutic drug level monitoring: Secondary | ICD-10-CM

## 2016-02-01 LAB — POCT INR: INR: 2.4

## 2016-02-19 ENCOUNTER — Other Ambulatory Visit: Payer: Self-pay | Admitting: Interventional Cardiology

## 2016-02-26 ENCOUNTER — Ambulatory Visit (INDEPENDENT_AMBULATORY_CARE_PROVIDER_SITE_OTHER): Payer: Medicare Other | Admitting: *Deleted

## 2016-02-26 DIAGNOSIS — Z5181 Encounter for therapeutic drug level monitoring: Secondary | ICD-10-CM

## 2016-02-26 DIAGNOSIS — I4891 Unspecified atrial fibrillation: Secondary | ICD-10-CM | POA: Diagnosis not present

## 2016-02-26 LAB — POCT INR: INR: 2.4

## 2016-03-06 ENCOUNTER — Telehealth: Payer: Self-pay | Admitting: Interventional Cardiology

## 2016-03-06 NOTE — Telephone Encounter (Signed)
New message   Pt states that she would like to have the oxygen tank picked up from her home. Please call.

## 2016-03-06 NOTE — Telephone Encounter (Signed)
The pt states that she has O2 tanks at her home that was ordered for her at a hospital D/c in the past and she wants [facility] to come pick up all of them except the tank that she uses in her bedroom. I advised her in our phone conversation this morning that Dr Eldridge Dace is unaware of her being on oxygen therapy. She states that she called her PCP and was advised that they were unaware that she was on oxygen also.  I called and spoke with Annice Pih at [facility]. Annice Pih confirmed that the pt does have O2 tanks that they delivered to her. I asked her which MD was signing CMN orders for the pt to have oxygen at home and she checked and stated that she does not know but will check into this matter and f/u with the ordering physician.  I called the pt back and explained all of the above. She verbalized understanding and thanked me for my help with this matter.

## 2016-03-06 NOTE — Telephone Encounter (Signed)
**Note De-Identified [name] Obfuscation** The pt states that she has two O2 tanks at her home but is only using one of them. She wants [facility] to pick one of them up and states that she has called them but they have not pick it up. She is advised to contact her PCP as Dr Eldridge Dace was unaware of her being on oxygen so more than likely her PCP ordered O2 therapy. She states that she will contact Dr Clovis Riley and ask them to contact [facility]. She verbalized understanding and thanked me for calling her back.

## 2016-03-06 NOTE — Telephone Encounter (Signed)
New Message  Pt husband call requesting to speak with RN about pts oxgyen tank. Please call back to discuss

## 2016-03-26 ENCOUNTER — Ambulatory Visit (INDEPENDENT_AMBULATORY_CARE_PROVIDER_SITE_OTHER): Payer: Medicare Other | Admitting: *Deleted

## 2016-03-26 DIAGNOSIS — I4891 Unspecified atrial fibrillation: Secondary | ICD-10-CM

## 2016-03-26 DIAGNOSIS — Z5181 Encounter for therapeutic drug level monitoring: Secondary | ICD-10-CM

## 2016-03-26 LAB — POCT INR: INR: 2.5

## 2016-04-08 ENCOUNTER — Telehealth: Payer: Self-pay | Admitting: Cardiology

## 2016-04-08 ENCOUNTER — Encounter: Payer: Medicare Other | Admitting: *Deleted

## 2016-04-08 NOTE — Telephone Encounter (Signed)
LMOVM reminding pt to send remote transmission.   

## 2016-04-11 ENCOUNTER — Encounter: Payer: Self-pay | Admitting: Cardiology

## 2016-04-14 ENCOUNTER — Telehealth: Payer: Self-pay | Admitting: Cardiology

## 2016-04-14 NOTE — Telephone Encounter (Signed)
Pt called and stated that she can not get her home monitor to work. Pt wirex adapter doesn't get signal at her home. She has even attempted to moved the wirex around her home and is still unable to get a signal. Pt said that her 01-04-2016 remote she used her land line phone to send the transmission. But she tried that today and she got the same problem as she did when she used her wirex adapter. Pt agreed to an appt on 07-23-2016 at 11:30 AM .

## 2016-04-23 ENCOUNTER — Ambulatory Visit (INDEPENDENT_AMBULATORY_CARE_PROVIDER_SITE_OTHER): Payer: Medicare Other | Admitting: *Deleted

## 2016-04-23 ENCOUNTER — Encounter (INDEPENDENT_AMBULATORY_CARE_PROVIDER_SITE_OTHER): Payer: Self-pay

## 2016-04-23 DIAGNOSIS — Z5181 Encounter for therapeutic drug level monitoring: Secondary | ICD-10-CM | POA: Diagnosis not present

## 2016-04-23 DIAGNOSIS — I4891 Unspecified atrial fibrillation: Secondary | ICD-10-CM | POA: Diagnosis not present

## 2016-04-23 LAB — POCT INR: INR: 2

## 2016-05-16 DIAGNOSIS — Z23 Encounter for immunization: Secondary | ICD-10-CM | POA: Diagnosis not present

## 2016-05-16 DIAGNOSIS — R102 Pelvic and perineal pain: Secondary | ICD-10-CM | POA: Diagnosis not present

## 2016-05-21 ENCOUNTER — Ambulatory Visit (INDEPENDENT_AMBULATORY_CARE_PROVIDER_SITE_OTHER): Payer: Medicare Other | Admitting: Pharmacist

## 2016-05-21 DIAGNOSIS — Z5181 Encounter for therapeutic drug level monitoring: Secondary | ICD-10-CM | POA: Diagnosis not present

## 2016-05-21 DIAGNOSIS — I4891 Unspecified atrial fibrillation: Secondary | ICD-10-CM

## 2016-05-21 LAB — POCT INR: INR: 2

## 2016-06-03 ENCOUNTER — Encounter: Payer: Self-pay | Admitting: Interventional Cardiology

## 2016-06-16 NOTE — Progress Notes (Signed)
Patient ID: [name], female   DOB: 1919/08/04, 82 y.o.   MRN: [id number]     Cardiology Office Note   Date:  06/17/2016   ID:  [name], DOB 1919/08/04, MRN [id number]  PCP:  Lupe Carneyean Mitchell, MD    No chief complaint on file.  Follow-up atrial fibrillation  Wt Readings from Last 3 Encounters:  06/17/16 57.7 kg (127 lb 3.2 oz)  09/20/15 60.3 kg (133 lb)  09/04/15 59.2 kg (130 lb 9.6 oz)       History of Present Illness: [name] is a 82 y.o. female  who has had AFib and HTN. Feels well. BP has been well controlled, around 120/70s. Tries exercise daily, no SHOB or chest pain.  Atrial Fibrillation F/U:  Denies : Chest pain.  Dizziness. .  Palpitations.   Syncope.  She has been hospitalized for fluid overload in the past. She spent about 2 days in the hospital. She was diuresed. She has been on a more consistent dose of Lasix. She weighs yourself daily. She minimizes salt. Overall, she has done very well since being in the hospital.   She is moving to a retirement community.  This is a source of stress.  Her husband also has neuropathy and is less active.       Past Medical History:  Diagnosis Date  . Anxiety   . Atrial fibrillation (HCC)   . Burning mouth syndrome   . CAD (coronary artery disease)   . Coronary atherosclerosis of native coronary artery   . Dysphagia   . GERD (gastroesophageal reflux disease)   . HTN (hypertension)   . Insomnia     Past Surgical History:  Procedure Laterality Date  . BREAST LUMPECTOMY    . KIDNEY STONE SURGERY    . TONSILLECTOMY       Current Outpatient Prescriptions  Medication Sig Dispense Refill  . Ascorbic Acid (VITAMIN C PO) Take 1 tablet by mouth daily.    Marland Kitchen. CALCIUM-VITAMIN D PO Take 1 tablet by mouth daily.    . CHOLECALCIFEROL PO Take 1 tablet by mouth daily.    . clonazePAM (KLONOPIN) 0.5 MG tablet Take 0.5 mg by mouth daily.    . Coenzyme Q10 (COQ10 PO) Take 1 capsule by mouth daily.    Marland Kitchen.  diltiazem (CARDIZEM LA) 360 MG 24 hr tablet TAKE ONE TABLET BY MOUTH ONCE DAILY 30 tablet 1  . furosemide (LASIX) 40 MG tablet Take 1 tablet daily. May take 1 additional tablet daily for increased weight/SOB and edema as directed. 60 tablet 9  . losartan (COZAAR) 25 MG tablet Take 1 tablet (25 mg total) by mouth daily. 30 tablet 10  . LYRICA 50 MG capsule Take 2 capsules by mouth 2 (two) times daily.     Marland Kitchen. MAGNESIUM PO Take 1 tablet by mouth 2 (two) times daily.    . medroxyPROGESTERone (PROVERA) 10 MG tablet Take 1 tablet by mouth daily.    . Omega-3 Fatty Acids (FISH OIL PO) Take 1 capsule by mouth 2 (two) times daily.    . potassium chloride SA (KLOR-CON M20) 20 MEQ tablet Take 1 tablet (20 mEq total) by mouth daily. 90 tablet 3  . PREMARIN 0.625 MG tablet Take 0.625 mg by mouth daily.    Marland Kitchen. TAZTIA XT 300 MG 24 hr capsule TAKE ONE CAPSULE BY MOUTH ONCE DAILY 90 capsule 2  . VITAMIN A PO Take 1 capsule by mouth daily.    Marland Kitchen. VITAMIN E  PO Take 1 capsule by mouth daily.    Marland Kitchen. warfarin (COUMADIN) 2.5 MG tablet Take as directed by coumadin clinic 50 tablet 3   No current facility-administered medications for this visit.     Allergies:   Cortisone and Metoprolol    Social History:  The patient  reports that she has never smoked. She has never used smokeless tobacco. She reports that she does not drink alcohol or use drugs.   Family History:  The patient's family history includes Bladder Cancer in her brother, father, and paternal grandfather; Hypertension in her father; Leukemia in her mother; Liver cancer in her sister.    ROS:  Please see the history of present illness.   Otherwise, review of systems are positive for recent fluid overload-improved.   All other systems are reviewed and negative.    PHYSICAL EXAM: VS:  BP (!) 110/50   Pulse 83   Ht 5\' 1"  (1.549 m)   Wt 57.7 kg (127 lb 3.2 oz)   BMI 24.03 kg/m  , BMI Body mass index is 24.03 kg/m. GEN: Well nourished, well developed, in  no acute distress  HEENT: normal  Neck: no JVD, carotid bruits, or masses Cardiac: Irregularly irregular; no murmurs, rubs, or gallops,no edema  Respiratory:  clear to auscultation bilaterally, normal work of breathing GI: soft, nontender, nondistended, + BS MS: no deformity or atrophy  Skin: warm and dry, no rash Neuro:  Strength and sensation are intact Psych: euthymic mood, full affect   EKG:   The ekg ordered today demonstrates AFib, rate controlled   Recent Labs: 08/26/2015: B Natriuretic Peptide 215.4 08/27/2015: TSH 1.344 08/28/2015: ALT 26; BUN 23; Creatinine, Ser 0.78; Hemoglobin 13.5; Platelets 159; Potassium 4.0; Sodium 141   Lipid Panel No results found for: CHOL, TRIG, HDL, CHOLHDL, VLDL, LDLCALC, LDLDIRECT   Other studies Reviewed: Additional studies/ records that were reviewed today with results demonstrating: normal LVEF in 1/17.   ASSESSMENT AND PLAN:  1. Atrial fibrillation: Rate controlled. Continue current medicines. She did not tolerate metoprolol in the past. We just adjust the diltiazem dose as needed, 300-360mg .  She takes the higher dose when her BP is elevated. Coumadin for stroke prevention. No bleeding issues. She has not had any falls recently. Continue anticoagulation. 2. Hypertension: Blood pressure well controlled. Continue losartan. 3. Edema/chronic diastolic heart failure: Managed with furosemide. Elevate legs when possible.  Minimize salt. Stressed the importance of daily weights. 4. Skin discoloration: This started when she took amiodarone. There is likely a component of venous insufficiency as well. Would not plan any further management of this issue. 5. Continue with routine pacemaker checks.   Current medicines are reviewed at length with the patient today.  The patient concerns regarding her medicines were addressed.  The following changes have been made:  No change  Labs/ tests ordered today include:   No orders of the defined types  were placed in this encounter.   Recommend 150 minutes/week of aerobic exercise Low fat, low carb, high fiber diet recommended  Disposition:   FU in 9 months   Signed, Lance MussJayadeep Lukus Binion, MD  06/17/2016 12:07 PM    [facility] [address], [place], [place]  [address] Phone: [phone]; Fax: [phone]

## 2016-06-17 ENCOUNTER — Encounter: Payer: Self-pay | Admitting: Interventional Cardiology

## 2016-06-17 ENCOUNTER — Ambulatory Visit (INDEPENDENT_AMBULATORY_CARE_PROVIDER_SITE_OTHER): Payer: Medicare Other | Admitting: Interventional Cardiology

## 2016-06-17 VITALS — BP 110/50 | HR 83 | Ht 61.0 in | Wt 127.2 lb

## 2016-06-17 DIAGNOSIS — R600 Localized edema: Secondary | ICD-10-CM | POA: Diagnosis not present

## 2016-06-17 DIAGNOSIS — I4819 Other persistent atrial fibrillation: Secondary | ICD-10-CM

## 2016-06-17 DIAGNOSIS — I481 Persistent atrial fibrillation: Secondary | ICD-10-CM

## 2016-06-17 DIAGNOSIS — I5032 Chronic diastolic (congestive) heart failure: Secondary | ICD-10-CM

## 2016-06-17 DIAGNOSIS — I10 Essential (primary) hypertension: Secondary | ICD-10-CM | POA: Diagnosis not present

## 2016-06-17 NOTE — Patient Instructions (Signed)
Medication Instructions:  Same-no change  Labwork: None  Testing/Procedures: None  Follow-Up: Your physician wants you to follow-up in: 9 months. You will receive a reminder letter in the mail two months in advance. If you don't receive a letter, please call our office to schedule the follow-up appointment.     If you need a refill on your cardiac medications before your next appointment, please call your pharmacy.

## 2016-06-24 ENCOUNTER — Other Ambulatory Visit: Payer: Self-pay | Admitting: Interventional Cardiology

## 2016-07-02 ENCOUNTER — Ambulatory Visit (INDEPENDENT_AMBULATORY_CARE_PROVIDER_SITE_OTHER): Payer: Medicare Other | Admitting: Pharmacist Clinician (PhC)/ Clinical Pharmacy Specialist

## 2016-07-02 DIAGNOSIS — I4891 Unspecified atrial fibrillation: Secondary | ICD-10-CM

## 2016-07-02 DIAGNOSIS — Z5181 Encounter for therapeutic drug level monitoring: Secondary | ICD-10-CM | POA: Diagnosis not present

## 2016-07-02 LAB — POCT INR: INR: 2.4

## 2016-07-21 DIAGNOSIS — H2513 Age-related nuclear cataract, bilateral: Secondary | ICD-10-CM | POA: Diagnosis not present

## 2016-07-23 ENCOUNTER — Ambulatory Visit (INDEPENDENT_AMBULATORY_CARE_PROVIDER_SITE_OTHER): Payer: Medicare Other | Admitting: *Deleted

## 2016-07-23 DIAGNOSIS — Z950 Presence of cardiac pacemaker: Secondary | ICD-10-CM | POA: Diagnosis not present

## 2016-07-23 DIAGNOSIS — I481 Persistent atrial fibrillation: Secondary | ICD-10-CM

## 2016-07-23 DIAGNOSIS — I4819 Other persistent atrial fibrillation: Secondary | ICD-10-CM

## 2016-07-23 LAB — CUP PACEART INCLINIC DEVICE CHECK
Battery Remaining Longevity: 76 mo
Battery Voltage: 2.77 V
Brady Statistic RV Percent Paced: 32 %
Date Time Interrogation Session: 20171220132209
Implantable Lead Location: 753859
Implantable Lead Model: 5076
Lead Channel Setting Pacing Amplitude: 2.5 V
Lead Channel Setting Pacing Pulse Width: 0.4 ms
Lead Channel Setting Sensing Sensitivity: 5.6 mV
MDC IDC LEAD IMPLANT DT: 20111202
MDC IDC LEAD IMPLANT DT: 20111202
MDC IDC LEAD LOCATION: 753860
MDC IDC MSMT BATTERY IMPEDANCE: 715 Ohm
MDC IDC MSMT LEADCHNL RA IMPEDANCE VALUE: 67 Ohm
MDC IDC MSMT LEADCHNL RV IMPEDANCE VALUE: 509 Ohm
MDC IDC MSMT LEADCHNL RV PACING THRESHOLD AMPLITUDE: 0.75 V
MDC IDC MSMT LEADCHNL RV PACING THRESHOLD PULSEWIDTH: 0.4 ms
MDC IDC MSMT LEADCHNL RV SENSING INTR AMPL: 11.2 mV
MDC IDC PG IMPLANT DT: 20111202

## 2016-07-23 NOTE — Progress Notes (Signed)
Pacemaker check in clinic. Normal device function. Thresholds, sensing, impedances consistent with previous measurements. Device programmed to maximize longevity. Persistent AF +warfarin. 9 high ventricular rates noted--likely AF w/RVR per markers, reprogrammed EGM storage to VEGMs. Device programmed at appropriate safety margins. Histogram distribution appropriate for patient activity level. Device programmed to optimize intrinsic conduction. Estimated longevity 6.5 years. Patient enrolled in remote follow-up, but cannot use WireX due to poor cell signal at home. Patient education completed. ROV with GT on 09/03/16.

## 2016-09-03 ENCOUNTER — Ambulatory Visit (INDEPENDENT_AMBULATORY_CARE_PROVIDER_SITE_OTHER): Payer: Medicare Other | Admitting: Pharmacist

## 2016-09-03 ENCOUNTER — Ambulatory Visit (INDEPENDENT_AMBULATORY_CARE_PROVIDER_SITE_OTHER): Payer: Medicare Other | Admitting: Internal Medicine

## 2016-09-03 ENCOUNTER — Encounter (INDEPENDENT_AMBULATORY_CARE_PROVIDER_SITE_OTHER): Payer: Self-pay

## 2016-09-03 ENCOUNTER — Encounter: Payer: Self-pay | Admitting: Internal Medicine

## 2016-09-03 VITALS — BP 154/74 | HR 96 | Ht 61.5 in | Wt 128.4 lb

## 2016-09-03 DIAGNOSIS — Z5181 Encounter for therapeutic drug level monitoring: Secondary | ICD-10-CM

## 2016-09-03 DIAGNOSIS — Z950 Presence of cardiac pacemaker: Secondary | ICD-10-CM | POA: Diagnosis not present

## 2016-09-03 DIAGNOSIS — I4891 Unspecified atrial fibrillation: Secondary | ICD-10-CM | POA: Diagnosis not present

## 2016-09-03 LAB — CUP PACEART INCLINIC DEVICE CHECK
Battery Voltage: 2.77 V
Implantable Lead Implant Date: 20111202
Implantable Lead Location: 753859
Implantable Lead Model: 5076
Implantable Pulse Generator Implant Date: 20111202
Lead Channel Impedance Value: 525 Ohm
Lead Channel Impedance Value: 67 Ohm
Lead Channel Pacing Threshold Amplitude: 0.5 V
Lead Channel Pacing Threshold Pulse Width: 0.4 ms
Lead Channel Setting Sensing Sensitivity: 5.6 mV
MDC IDC LEAD IMPLANT DT: 20111202
MDC IDC LEAD LOCATION: 753860
MDC IDC MSMT BATTERY IMPEDANCE: 844 Ohm
MDC IDC MSMT BATTERY REMAINING LONGEVITY: 69 mo
MDC IDC MSMT LEADCHNL RV SENSING INTR AMPL: 11.2 mV
MDC IDC SESS DTM: 20180131135623
MDC IDC SET LEADCHNL RV PACING AMPLITUDE: 2.5 V
MDC IDC SET LEADCHNL RV PACING PULSEWIDTH: 0.4 ms
MDC IDC STAT BRADY RV PERCENT PACED: 34 %

## 2016-09-03 LAB — POCT INR: INR: 3.9

## 2016-09-03 NOTE — Patient Instructions (Signed)
Medication Instructions:  Your physician recommends that you continue on your current medications as directed. Please refer to the Current Medication list given to you today.   Labwork: None Ordered   Testing/Procedures: None Ordered   Follow-Up: Your physician wants you to follow-up in: 1 year with Dr. Taylor. You will receive a reminder letter in the mail two months in advance. If you don't receive a letter, please call our office to schedule the follow-up appointment.  Remote monitoring is used to monitor your Pacemaker from home. This monitoring reduces the number of office visits required to check your device to one time per year. It allows us to keep an eye on the functioning of your device to ensure it is working properly. You are scheduled for a device check from home on 12/03/16. You may send your transmission at any time that day. If you have a wireless device, the transmission will be sent automatically. After your physician reviews your transmission, you will receive a postcard with your next transmission date.      Any Other Special Instructions Will Be Listed Below (If Applicable).    If you need a refill on your cardiac medications before your next appointment, please call your pharmacy.   

## 2016-09-03 NOTE — Progress Notes (Signed)
Patient ID: [name], female   DOB: 11/28/1918, 83 y.o.   MRN: [id number]    [address] [place], [place]  [address] Phone: [phone] Fax:  [phone]  Date:  09/03/2016   ID:  Stacy CholCarolyn M Egnew, DOB 11/28/1918, MRN [id number]  PCP:  Lupe Carneyean Mitchell, MD      History of Present Illness: Stacy CholCarolyn M Barnier is a 83 y.o. female who has had AFib and HTN and symptomatic bradycardia, status post permanent pacemaker insertion. Her BP has been controlled although today she was in a hurry and notes that she is a bit more nervous.  She denies syncope, chest pain, or shortness of breath. She has done amazingly well despite her advanced age. No peripheral edema or palpitations. She notes that she was recently in the hospital for 2 days with fluid overload. She has been watching her weight.   Wt Readings from Last 3 Encounters:  09/03/16 128 lb 6.4 oz (58.2 kg)  06/17/16 127 lb 3.2 oz (57.7 kg)  09/20/15 133 lb (60.3 kg)     Past Medical History:  Diagnosis Date  . Anxiety   . Atrial fibrillation (HCC)   . Burning mouth syndrome   . CAD (coronary artery disease)   . Coronary atherosclerosis of native coronary artery   . Dysphagia   . GERD (gastroesophageal reflux disease)   . HTN (hypertension)   . Insomnia     Current Outpatient Prescriptions  Medication Sig Dispense Refill  . Ascorbic Acid (VITAMIN C PO) Take 1 tablet by mouth daily.    Marland Kitchen. CALCIUM-VITAMIN D PO Take 1 tablet by mouth daily.    . CHOLECALCIFEROL PO Take 1 tablet by mouth daily.    . clonazePAM (KLONOPIN) 0.5 MG tablet Take 0.5 mg by mouth daily.    . Coenzyme Q10 (COQ10 PO) Take 1 capsule by mouth daily.    Marland Kitchen. diltiazem (CARDIZEM LA) 360 MG 24 hr tablet TAKE ONE TABLET BY MOUTH ONCE DAILY (Patient not taking: Reported on 07/23/2016) 30 tablet 1  . furosemide (LASIX) 40 MG tablet Take 1 tablet daily. May take 1 additional tablet daily for increased weight/SOB and edema as directed. 60 tablet 9  .  losartan (COZAAR) 25 MG tablet Take 1 tablet (25 mg total) by mouth daily. 30 tablet 10  . LYRICA 50 MG capsule Take 2 capsules by mouth 2 (two) times daily.     Marland Kitchen. MAGNESIUM PO Take 1 tablet by mouth 2 (two) times daily.    . medroxyPROGESTERone (PROVERA) 10 MG tablet Take 1 tablet by mouth daily.    . Omega-3 Fatty Acids (FISH OIL PO) Take 1 capsule by mouth 2 (two) times daily.    . potassium chloride SA (KLOR-CON M20) 20 MEQ tablet Take 1 tablet (20 mEq total) by mouth daily. 90 tablet 3  . PREMARIN 0.625 MG tablet Take 0.625 mg by mouth daily.    Marland Kitchen. TAZTIA XT 300 MG 24 hr capsule TAKE ONE CAPSULE BY MOUTH ONCE DAILY 90 capsule 2  . VITAMIN A PO Take 1 capsule by mouth daily.    Marland Kitchen. VITAMIN E PO Take 1 capsule by mouth daily.    Marland Kitchen. warfarin (COUMADIN) 2.5 MG tablet TAKE AS DIRECTED BY COUMADIN CLINIC 50 tablet 3   No current facility-administered medications for this visit.     Allergies:    Allergies  Allergen Reactions  . Cortisone Other (See Comments)    REACTION: "STOMACH BURNS"  . Metoprolol Diarrhea  Social History:  The patient  reports that she has never smoked. She has never used smokeless tobacco. She reports that she does not drink alcohol or use drugs.   Family History:  The patient's family history includes Bladder Cancer in her brother, father, and paternal grandfather; Hypertension in her father; Leukemia in her mother; Liver cancer in her sister.   ROS:  Please see the history of present illness.  No nausea, vomiting.  No fevers, chills.  No focal weakness.  No dysuria.   Alli other systems reviewed and negative.   PHYSICAL EXAM: VS: BP - 130/62, P- 98, r - 16, Wt. - 130   Well nourished, well developed, in no acute distress  HEENT: normal  Neck: 6 cm JVD , no carotid bruits Cardiac:  normal S1, S2; irregularly irregular Lungs:  clear to auscultation bilaterally, no wheezing, rhonchi; rare basilar rales  Abd: soft, nontender, no hepatomegaly  Ext: no edema   Skin: warm and dry  Neuro:   no focal abnormalities noted   ASSESSMENT AND PLAN:  Atrial fibrillation  Continue Diltiazem HCl Coated Beads Tablet Extended Release 24 Hour, 300 MG, 1 capsule, Orally, Once a day, Notes: switches to 360 mg for tachycardia at times  Notes: Rate controlled.     2. Essential hypertension, benign  Continue Losartan Potassium Tablet, 100 MG, 1 tablet, Orally, once a day Notes: Controlled.    Preventive Medicine  Adult topics discussed:  Diet: healthy diet.  Exercise: 5 days a week,, at least 30 minutes of aerobic exercise.   3. PPM - her medtronic VVI PM is working normally. Will recheck in several months.    Lewayne Bunting, M.D. 09/03/2016 11:14 AM

## 2016-09-09 ENCOUNTER — Other Ambulatory Visit: Payer: Self-pay | Admitting: Interventional Cardiology

## 2016-09-15 ENCOUNTER — Other Ambulatory Visit: Payer: Self-pay | Admitting: Interventional Cardiology

## 2016-10-01 ENCOUNTER — Ambulatory Visit (INDEPENDENT_AMBULATORY_CARE_PROVIDER_SITE_OTHER): Payer: Medicare Other | Admitting: *Deleted

## 2016-10-01 DIAGNOSIS — Z5181 Encounter for therapeutic drug level monitoring: Secondary | ICD-10-CM | POA: Diagnosis not present

## 2016-10-01 DIAGNOSIS — I4891 Unspecified atrial fibrillation: Secondary | ICD-10-CM

## 2016-10-01 LAB — POCT INR: INR: 3.2

## 2016-10-02 ENCOUNTER — Emergency Department (HOSPITAL_COMMUNITY): Payer: Medicare Other

## 2016-10-02 ENCOUNTER — Encounter (HOSPITAL_COMMUNITY): Payer: Self-pay

## 2016-10-02 DIAGNOSIS — S0990XA Unspecified injury of head, initial encounter: Secondary | ICD-10-CM | POA: Insufficient documentation

## 2016-10-02 DIAGNOSIS — Z7901 Long term (current) use of anticoagulants: Secondary | ICD-10-CM | POA: Insufficient documentation

## 2016-10-02 DIAGNOSIS — W01198A Fall on same level from slipping, tripping and stumbling with subsequent striking against other object, initial encounter: Secondary | ICD-10-CM | POA: Diagnosis not present

## 2016-10-02 DIAGNOSIS — I2510 Atherosclerotic heart disease of native coronary artery without angina pectoris: Secondary | ICD-10-CM | POA: Insufficient documentation

## 2016-10-02 DIAGNOSIS — R51 Headache: Secondary | ICD-10-CM | POA: Diagnosis not present

## 2016-10-02 DIAGNOSIS — Z79899 Other long term (current) drug therapy: Secondary | ICD-10-CM | POA: Insufficient documentation

## 2016-10-02 DIAGNOSIS — Y939 Activity, unspecified: Secondary | ICD-10-CM | POA: Insufficient documentation

## 2016-10-02 DIAGNOSIS — Y92009 Unspecified place in unspecified non-institutional (private) residence as the place of occurrence of the external cause: Secondary | ICD-10-CM | POA: Diagnosis not present

## 2016-10-02 DIAGNOSIS — Y999 Unspecified external cause status: Secondary | ICD-10-CM | POA: Insufficient documentation

## 2016-10-02 DIAGNOSIS — I110 Hypertensive heart disease with heart failure: Secondary | ICD-10-CM | POA: Insufficient documentation

## 2016-10-02 DIAGNOSIS — I509 Heart failure, unspecified: Secondary | ICD-10-CM | POA: Insufficient documentation

## 2016-10-02 DIAGNOSIS — Z950 Presence of cardiac pacemaker: Secondary | ICD-10-CM | POA: Insufficient documentation

## 2016-10-02 DIAGNOSIS — S098XXA Other specified injuries of head, initial encounter: Secondary | ICD-10-CM | POA: Diagnosis not present

## 2016-10-02 NOTE — ED Triage Notes (Addendum)
Pt endorses mechanical fall at home where she slipped and hit her head on a sheetrock wall. Pt is on blood coumadin. Pt endorses some discomfort in her head. No obvious injury. Pt was ambulatory after fall. Pt alert and oriented x 4. PERRL. VSS. Neuro exam normal.

## 2016-10-03 ENCOUNTER — Emergency Department (HOSPITAL_COMMUNITY)
Admission: EM | Admit: 2016-10-03 | Discharge: 2016-10-03 | Disposition: A | Discharge status: Home / Self Care | Payer: Medicare Other | Attending: Emergency Medicine | Admitting: Emergency Medicine

## 2016-10-03 DIAGNOSIS — Y92009 Unspecified place in unspecified non-institutional (private) residence as the place of occurrence of the external cause: Secondary | ICD-10-CM

## 2016-10-03 DIAGNOSIS — W19XXXA Unspecified fall, initial encounter: Secondary | ICD-10-CM

## 2016-10-03 DIAGNOSIS — S0990XA Unspecified injury of head, initial encounter: Secondary | ICD-10-CM

## 2016-10-03 NOTE — ED Notes (Signed)
Pt stable, understands discharge instructions, and reasons for return.   

## 2016-10-03 NOTE — Discharge Instructions (Signed)
I recommend taking Tylenol as prescribed over-the-counter as needed for pain relief. I recommend follow-up with your primary care provider in 2-3 days as needed. Please return to the Emergency Department if symptoms worsen or new onset of fever, headache, lightheadedness, dizziness, visual changes, confusion, altered mental status, chest pain, shortness of breath, vomiting, numbness, weakness, syncope, seizure.

## 2016-10-03 NOTE — ED Provider Notes (Signed)
[facility] DEPT Provider Note   CSN: [id number] Arrival date & time: 10/02/16  2234     History   Chief Complaint Chief Complaint  Patient presents with  . Fall  . Head Injury    HPI [name] is a 83 y.o. female.  HPI   Patient is a 83 year old female with history of A. fib on Coumadin, CAD, hypertension who presents to the ED status post mechanical fall that occurred around 2:30 PM this afternoon. Patient reports she was carrying a box walking in her house and notes she slipped on her husbands jacket resulting in her falling backwards and hitting her head against the wall. Denies LOC. Patient reports having mild associated headache initially which has since resolved. She currently denies any pain or complaints. Denies headache, lightheadedness, dizziness, visual changes, chest pain, shortness of breath, cough, abdominal pain, nausea, vomiting, urinary symptoms, neck/back pain, numbness, weakness, syncope. Patient denies taking any medications prior to arrival.  Past Medical History:  Diagnosis Date  . Anxiety   . Atrial fibrillation (HCC)   . Burning mouth syndrome   . CAD (coronary artery disease)   . Coronary atherosclerosis of native coronary artery   . Dysphagia   . GERD (gastroesophageal reflux disease)   . HTN (hypertension)   . Insomnia     Patient Active Problem List   Diagnosis Date Noted  . Chronic diastolic heart failure (HCC) 09/20/2015  . CHF (congestive heart failure) (HCC) 08/28/2015  . Dyspnea 08/26/2015  . Fatigue 08/26/2015  . Leg edema 08/14/2014  . Cardiac pacemaker in situ 01/10/2014  . Encounter for therapeutic drug monitoring 09/06/2013  . HTN (hypertension)   . Anxiety   . GERD (gastroesophageal reflux disease)   . Insomnia   . Coronary atherosclerosis of native coronary artery   . Dysphagia   . CAD (coronary artery disease)   . Burning mouth syndrome   . Atrial fibrillation (HCC) 05/06/2013    Past Surgical History:    Procedure Laterality Date  . BREAST LUMPECTOMY    . KIDNEY STONE SURGERY    . TONSILLECTOMY      OB History    No data available       Home Medications    Prior to Admission medications   Medication Sig Start Date End Date Taking? Authorizing Provider  Ascorbic Acid (VITAMIN C PO) Take 1 tablet by mouth daily.    Historical Provider, MD  CALCIUM-VITAMIN D PO Take 1 tablet by mouth daily.    Historical Provider, MD  CHOLECALCIFEROL PO Take 1 tablet by mouth daily.    Historical Provider, MD  clonazePAM (KLONOPIN) 0.5 MG tablet Take 0.5 mg by mouth daily. 08/01/15   Historical Provider, MD  Coenzyme Q10 (COQ10 PO) Take 1 capsule by mouth daily.    Historical Provider, MD  diltiazem (CARDIZEM LA) 360 MG 24 hr tablet TAKE ONE TABLET BY MOUTH ONCE DAILY Patient not taking: Reported on 07/23/2016 09/11/15   Corky Crafts, MD  furosemide (LASIX) 40 MG tablet Take 1 tablet daily. May take 1 additional tablet daily for increased weight/SOB and edema as directed. 09/20/15   Corky Crafts, MD  losartan (COZAAR) 25 MG tablet TAKE ONE TABLET BY MOUTH ONCE DAILY 09/16/16   Corky Crafts, MD  LYRICA 50 MG capsule Take 2 capsules by mouth 2 (two) times daily.  04/26/13   Historical Provider, MD  MAGNESIUM PO Take 1 tablet by mouth 2 (two) times daily.    Historical Provider,  MD  medroxyPROGESTERone (PROVERA) 10 MG tablet Take 1 tablet by mouth daily. 04/21/13   Historical Provider, MD  Omega-3 Fatty Acids (FISH OIL PO) Take 1 capsule by mouth 2 (two) times daily.    Historical Provider, MD  potassium chloride SA (KLOR-CON M20) 20 MEQ tablet Take 1 tablet (20 mEq total) by mouth daily. 01/29/16   Corky Crafts, MD  PREMARIN 0.625 MG tablet Take 0.625 mg by mouth daily. 08/28/15   Historical Provider, MD  TAZTIA XT 300 MG 24 hr capsule TAKE ONE CAPSULE BY MOUTH ONCE DAILY 09/11/16   Corky Crafts, MD  VITAMIN A PO Take 1 capsule by mouth daily.    Historical Provider, MD  VITAMIN  E PO Take 1 capsule by mouth daily.    Historical Provider, MD  warfarin (COUMADIN) 2.5 MG tablet TAKE AS DIRECTED BY [facility]MADIN CLINIC 06/24/16   Corky Crafts, MD    Family History Family History  Problem Relation Age of Onset  . Liver cancer Sister   . Leukemia Mother     ACUTE  . Bladder Cancer Father   . Hypertension Father   . Bladder Cancer Brother   . Bladder Cancer Paternal Grandfather   . Heart attack Neg Hx   . Stroke Neg Hx     Social History Social History  Substance Use Topics  . Smoking status: Never Smoker  . Smokeless tobacco: Never Used  . Alcohol use No     Allergies   Cortisone and Metoprolol   Review of Systems Review of Systems  HENT:       Head injury  All other systems reviewed and are negative.    Physical Exam Updated Vital Signs BP 136/59 (BP Location: Left Arm)   Pulse 71   Temp 97.9 F (36.6 C) (Oral)   Resp 16   Ht 5\' 1"  (1.549 m)   Wt 57.6 kg   SpO2 95%   BMI 24.00 kg/m   Physical Exam  Constitutional: She is oriented to person, place, and time. She appears well-developed and well-nourished. No distress.  HENT:  Head: Normocephalic and atraumatic. Head is without raccoon's eyes, without Battle's sign, without abrasion, without contusion and without laceration.  Right Ear: Tympanic membrane normal. No hemotympanum.  Left Ear: Tympanic membrane normal. No hemotympanum.  Nose: Nose normal. No sinus tenderness, nasal deformity, septal deviation or nasal septal hematoma. No epistaxis.  Mouth/Throat: Uvula is midline, oropharynx is clear and moist and mucous membranes are normal. No oropharyngeal exudate, posterior oropharyngeal edema, posterior oropharyngeal erythema or tonsillar abscesses.  Eyes: Conjunctivae and EOM are normal. Pupils are equal, round, and reactive to light. Right eye exhibits no discharge. Left eye exhibits no discharge. No scleral icterus.  Neck: Normal range of motion. Neck supple.  Cardiovascular:  Normal rate, normal heart sounds and intact distal pulses.   Irregularly irregular rhythm  Pulmonary/Chest: Effort normal and breath sounds normal. No respiratory distress. She has no wheezes. She has no rales. She exhibits no tenderness.  Abdominal: Soft. Bowel sounds are normal. She exhibits no distension and no mass. There is no tenderness. There is no rebound and no guarding.  Musculoskeletal: Normal range of motion. She exhibits no edema or tenderness.  No cervical, thoracic, or lumbar spine midline TTP.  Full ROM of bilateral upper and lower extremities with 5/5 strength.   2+ radial and PT pulses. Sensation grossly intact. Patient able to stand and ambulate without assistance, no ataxia noted.   Neurological: She is  alert and oriented to person, place, and time. She has normal strength. No cranial nerve deficit or sensory deficit. Coordination and gait normal.  Skin: Skin is warm and dry. Capillary refill takes less than 2 seconds. She is not diaphoretic.  Nursing note and vitals reviewed.    ED Treatments / Results  Labs (all labs ordered are listed, but only abnormal results are displayed) Labs Reviewed - No data to display  EKG  EKG Interpretation  Date/Time:  Thursday October 02 2016 22:47:20 EST Ventricular Rate:  84 PR Interval:    QRS Duration: 88 QT Interval:  388 QTC Calculation: 458 R Axis:   -38 Text Interpretation:  Atrial fibrillation with occasional ventricular-paced complexes Left axis deviation Anteroseptal infarct , age undetermined ST & T wave abnormality, consider lateral ischemia Abnormal ECG No significant change since last tracing Confirmed by WARD,  DO, KRISTEN [phone]) on 10/03/2016 12:24:29 AM       Radiology Ct Head Wo Contrast  Result Date: 10/02/2016 CLINICAL DATA:  Pain after mechanical fall EXAM: CT HEAD WITHOUT CONTRAST TECHNIQUE: Contiguous axial images were obtained from the base of the skull through the vertex without intravenous contrast.  COMPARISON:  None. FINDINGS: BRAIN: There is sulcal and ventricular prominence consistent with superficial and central atrophy. No intraparenchymal hemorrhage, mass effect nor midline shift. Periventricular and subcortical white matter hypodensities consistent with chronic small vessel ischemic disease are identified. No acute large vascular territory infarcts. No abnormal extra-axial fluid collections. Basal cisterns are not effaced and midline. VASCULAR: Moderate calcific atherosclerosis of the carotid siphons. SKULL: No skull fracture. No significant scalp soft tissue swelling. SINUSES/ORBITS: The mastoid air-cells are clear. The included paranasal sinuses are well-aerated.The included ocular globes and orbital contents are non-suspicious. OTHER: None. IMPRESSION: Cerebral atrophy with chronic appearing small vessel ischemic disease of periventricular and subcortical white matter. No acute intracranial appearing abnormality. Electronically Signed   By: Tollie Eth M.D.   On: 10/02/2016 23:47    Procedures Procedures (including critical care time)  Medications Ordered in ED Medications - No data to display   Initial Impression / Assessment and Plan / ED Course  I have reviewed the triage vital signs and the nursing notes.  Pertinent labs & imaging results that were available during my care of the patient were reviewed by me and considered in my medical decision making (see chart for details).     Patient presents status post mechanical fall that occurred prior to arrival. She reports slipping on her husbands jacket resulting in her falling backwards and hitting her head against the wall. Denies LOC. Reports history of A. fib and notes she is on Coumadin. Patient currently denies any pain or complaints. VSS. Exam unremarkable. No evidence of head injury or trauma. No neuro deficits. CT head with no acute abnormalities. EKG showed A. fib with occasional PVCs, left axis deviation, no significant  changes from prior. Discussed patient with Dr. Elesa Massed who also evaluated the patient in the ED. Patient has remained hemodynamically stable in the ED without any pain or complaints. Plan to discharge patient home with symptomatic treatment and advised to follow up with PCP N2 to 3 days as needed. Discussed strict return precautions.  Final Clinical Impressions(s) / ED Diagnoses   Final diagnoses:  Injury of head, initial encounter  Fall in home, initial encounter    New Prescriptions New Prescriptions   No medications on file     Barrett Henle, [place] 10/03/16 0981    Layla Maw Ward,  DO 10/03/16 [phone]

## 2016-10-15 ENCOUNTER — Ambulatory Visit (INDEPENDENT_AMBULATORY_CARE_PROVIDER_SITE_OTHER): Payer: Medicare Other | Admitting: *Deleted

## 2016-10-15 DIAGNOSIS — I4891 Unspecified atrial fibrillation: Secondary | ICD-10-CM

## 2016-10-15 DIAGNOSIS — Z5181 Encounter for therapeutic drug level monitoring: Secondary | ICD-10-CM | POA: Diagnosis not present

## 2016-10-15 LAB — POCT INR: INR: 2.4

## 2016-10-21 ENCOUNTER — Other Ambulatory Visit: Payer: Self-pay | Admitting: Interventional Cardiology

## 2016-11-04 ENCOUNTER — Other Ambulatory Visit: Payer: Self-pay | Admitting: *Deleted

## 2016-11-04 MED ORDER — FUROSEMIDE 40 MG PO TABS: 40.0000 mg | ORAL_TABLET | Freq: Every day | ORAL | 2 refills | Status: DC

## 2016-11-05 ENCOUNTER — Ambulatory Visit (INDEPENDENT_AMBULATORY_CARE_PROVIDER_SITE_OTHER): Payer: Medicare Other | Admitting: *Deleted

## 2016-11-05 DIAGNOSIS — I4891 Unspecified atrial fibrillation: Secondary | ICD-10-CM

## 2016-11-05 DIAGNOSIS — Z5181 Encounter for therapeutic drug level monitoring: Secondary | ICD-10-CM

## 2016-11-05 LAB — POCT INR: INR: 2.8

## 2016-11-26 DIAGNOSIS — R51 Headache: Secondary | ICD-10-CM | POA: Diagnosis not present

## 2016-12-03 ENCOUNTER — Ambulatory Visit (INDEPENDENT_AMBULATORY_CARE_PROVIDER_SITE_OTHER): Payer: Medicare Other | Admitting: *Deleted

## 2016-12-03 ENCOUNTER — Telehealth: Payer: Self-pay | Admitting: Cardiology

## 2016-12-03 DIAGNOSIS — Z5181 Encounter for therapeutic drug level monitoring: Secondary | ICD-10-CM

## 2016-12-03 DIAGNOSIS — I4891 Unspecified atrial fibrillation: Secondary | ICD-10-CM | POA: Diagnosis not present

## 2016-12-03 DIAGNOSIS — R001 Bradycardia, unspecified: Secondary | ICD-10-CM | POA: Diagnosis not present

## 2016-12-03 LAB — POCT INR: INR: 2.3

## 2016-12-03 NOTE — Telephone Encounter (Signed)
Spoke with pt and reminded pt of remote transmission that is due today. Pt verbalized understanding.   

## 2016-12-04 ENCOUNTER — Encounter: Payer: Self-pay | Admitting: Cardiology

## 2016-12-04 NOTE — Progress Notes (Signed)
Remote pacemaker transmission.   

## 2016-12-05 LAB — CUP PACEART REMOTE DEVICE CHECK
Battery Remaining Longevity: 65 mo
Battery Voltage: 2.77 V
Brady Statistic RV Percent Paced: 31 %
Implantable Lead Implant Date: 20111202
Implantable Lead Location: 753859
Implantable Lead Model: 5076
Implantable Lead Model: 5076
Implantable Pulse Generator Implant Date: 20111202
Lead Channel Impedance Value: 67 Ohm
Lead Channel Pacing Threshold Amplitude: 0.625 V
Lead Channel Pacing Threshold Pulse Width: 0.4 ms
Lead Channel Setting Pacing Amplitude: 2.5 V
Lead Channel Setting Pacing Pulse Width: 0.4 ms
Lead Channel Setting Sensing Sensitivity: 4 mV
MDC IDC LEAD IMPLANT DT: 20111202
MDC IDC LEAD LOCATION: 753860
MDC IDC MSMT BATTERY IMPEDANCE: 948 Ohm
MDC IDC MSMT LEADCHNL RV IMPEDANCE VALUE: 484 Ohm
MDC IDC MSMT LEADCHNL RV SENSING INTR AMPL: 8 mV
MDC IDC SESS DTM: 20180502192921

## 2016-12-22 DIAGNOSIS — R5383 Other fatigue: Secondary | ICD-10-CM | POA: Diagnosis not present

## 2016-12-23 ENCOUNTER — Other Ambulatory Visit: Payer: Self-pay | Admitting: *Deleted

## 2016-12-23 MED ORDER — POTASSIUM CHLORIDE CRYS ER 20 MEQ PO TBCR: 20.0000 meq | EXTENDED_RELEASE_TABLET | Freq: Every day | ORAL | 3 refills | Status: DC

## 2017-01-12 DIAGNOSIS — R5383 Other fatigue: Secondary | ICD-10-CM | POA: Diagnosis not present

## 2017-01-12 DIAGNOSIS — F419 Anxiety disorder, unspecified: Secondary | ICD-10-CM | POA: Diagnosis not present

## 2017-01-12 DIAGNOSIS — I509 Heart failure, unspecified: Secondary | ICD-10-CM | POA: Diagnosis not present

## 2017-01-12 DIAGNOSIS — K146 Glossodynia: Secondary | ICD-10-CM | POA: Diagnosis not present

## 2017-01-14 ENCOUNTER — Ambulatory Visit (INDEPENDENT_AMBULATORY_CARE_PROVIDER_SITE_OTHER): Payer: Medicare Other | Admitting: *Deleted

## 2017-01-14 DIAGNOSIS — Z5181 Encounter for therapeutic drug level monitoring: Secondary | ICD-10-CM | POA: Diagnosis not present

## 2017-01-14 DIAGNOSIS — I4891 Unspecified atrial fibrillation: Secondary | ICD-10-CM

## 2017-01-14 LAB — PROTIME-INR
INR: 9.5 (ref 0.8–1.2)
Prothrombin Time: 91.9 s — ABNORMAL HIGH (ref 9.1–12.0)

## 2017-01-14 LAB — POCT INR: INR: 8

## 2017-01-16 DIAGNOSIS — Z7901 Long term (current) use of anticoagulants: Secondary | ICD-10-CM | POA: Diagnosis not present

## 2017-01-16 DIAGNOSIS — F419 Anxiety disorder, unspecified: Secondary | ICD-10-CM | POA: Diagnosis not present

## 2017-01-16 DIAGNOSIS — R1310 Dysphagia, unspecified: Secondary | ICD-10-CM | POA: Diagnosis not present

## 2017-01-16 DIAGNOSIS — I2510 Atherosclerotic heart disease of native coronary artery without angina pectoris: Secondary | ICD-10-CM | POA: Diagnosis not present

## 2017-01-16 DIAGNOSIS — I110 Hypertensive heart disease with heart failure: Secondary | ICD-10-CM | POA: Diagnosis not present

## 2017-01-16 DIAGNOSIS — K219 Gastro-esophageal reflux disease without esophagitis: Secondary | ICD-10-CM | POA: Diagnosis not present

## 2017-01-16 DIAGNOSIS — R296 Repeated falls: Secondary | ICD-10-CM | POA: Diagnosis not present

## 2017-01-16 DIAGNOSIS — I4891 Unspecified atrial fibrillation: Secondary | ICD-10-CM | POA: Diagnosis not present

## 2017-01-16 DIAGNOSIS — Z5181 Encounter for therapeutic drug level monitoring: Secondary | ICD-10-CM | POA: Diagnosis not present

## 2017-01-16 DIAGNOSIS — I509 Heart failure, unspecified: Secondary | ICD-10-CM | POA: Diagnosis not present

## 2017-01-20 ENCOUNTER — Ambulatory Visit (INDEPENDENT_AMBULATORY_CARE_PROVIDER_SITE_OTHER): Payer: Medicare Other | Admitting: *Deleted

## 2017-01-20 DIAGNOSIS — Z5181 Encounter for therapeutic drug level monitoring: Secondary | ICD-10-CM | POA: Diagnosis not present

## 2017-01-20 DIAGNOSIS — R0600 Dyspnea, unspecified: Secondary | ICD-10-CM | POA: Diagnosis not present

## 2017-01-20 DIAGNOSIS — F411 Generalized anxiety disorder: Secondary | ICD-10-CM | POA: Diagnosis not present

## 2017-01-20 DIAGNOSIS — I4891 Unspecified atrial fibrillation: Secondary | ICD-10-CM

## 2017-01-20 LAB — POCT INR: INR: 1.2

## 2017-01-21 DIAGNOSIS — R1310 Dysphagia, unspecified: Secondary | ICD-10-CM | POA: Diagnosis not present

## 2017-01-21 DIAGNOSIS — I2510 Atherosclerotic heart disease of native coronary artery without angina pectoris: Secondary | ICD-10-CM | POA: Diagnosis not present

## 2017-01-21 DIAGNOSIS — I110 Hypertensive heart disease with heart failure: Secondary | ICD-10-CM | POA: Diagnosis not present

## 2017-01-21 DIAGNOSIS — R296 Repeated falls: Secondary | ICD-10-CM | POA: Diagnosis not present

## 2017-01-21 DIAGNOSIS — I4891 Unspecified atrial fibrillation: Secondary | ICD-10-CM | POA: Diagnosis not present

## 2017-01-21 DIAGNOSIS — I509 Heart failure, unspecified: Secondary | ICD-10-CM | POA: Diagnosis not present

## 2017-01-23 DIAGNOSIS — R1310 Dysphagia, unspecified: Secondary | ICD-10-CM | POA: Diagnosis not present

## 2017-01-23 DIAGNOSIS — R296 Repeated falls: Secondary | ICD-10-CM | POA: Diagnosis not present

## 2017-01-23 DIAGNOSIS — I4891 Unspecified atrial fibrillation: Secondary | ICD-10-CM | POA: Diagnosis not present

## 2017-01-23 DIAGNOSIS — I2510 Atherosclerotic heart disease of native coronary artery without angina pectoris: Secondary | ICD-10-CM | POA: Diagnosis not present

## 2017-01-23 DIAGNOSIS — I509 Heart failure, unspecified: Secondary | ICD-10-CM | POA: Diagnosis not present

## 2017-01-23 DIAGNOSIS — I110 Hypertensive heart disease with heart failure: Secondary | ICD-10-CM | POA: Diagnosis not present

## 2017-01-26 DIAGNOSIS — I110 Hypertensive heart disease with heart failure: Secondary | ICD-10-CM | POA: Diagnosis not present

## 2017-01-26 DIAGNOSIS — R296 Repeated falls: Secondary | ICD-10-CM | POA: Diagnosis not present

## 2017-01-26 DIAGNOSIS — R1310 Dysphagia, unspecified: Secondary | ICD-10-CM | POA: Diagnosis not present

## 2017-01-26 DIAGNOSIS — I2510 Atherosclerotic heart disease of native coronary artery without angina pectoris: Secondary | ICD-10-CM | POA: Diagnosis not present

## 2017-01-26 DIAGNOSIS — I4891 Unspecified atrial fibrillation: Secondary | ICD-10-CM | POA: Diagnosis not present

## 2017-01-26 DIAGNOSIS — I509 Heart failure, unspecified: Secondary | ICD-10-CM | POA: Diagnosis not present

## 2017-01-27 ENCOUNTER — Ambulatory Visit (INDEPENDENT_AMBULATORY_CARE_PROVIDER_SITE_OTHER): Payer: Self-pay | Admitting: Cardiology

## 2017-01-27 ENCOUNTER — Telehealth: Payer: Self-pay | Admitting: *Deleted

## 2017-01-27 DIAGNOSIS — R1310 Dysphagia, unspecified: Secondary | ICD-10-CM | POA: Diagnosis not present

## 2017-01-27 DIAGNOSIS — I110 Hypertensive heart disease with heart failure: Secondary | ICD-10-CM | POA: Diagnosis not present

## 2017-01-27 DIAGNOSIS — R296 Repeated falls: Secondary | ICD-10-CM | POA: Diagnosis not present

## 2017-01-27 DIAGNOSIS — I509 Heart failure, unspecified: Secondary | ICD-10-CM | POA: Diagnosis not present

## 2017-01-27 DIAGNOSIS — I4891 Unspecified atrial fibrillation: Secondary | ICD-10-CM

## 2017-01-27 DIAGNOSIS — Z5181 Encounter for therapeutic drug level monitoring: Secondary | ICD-10-CM

## 2017-01-27 DIAGNOSIS — I2510 Atherosclerotic heart disease of native coronary artery without angina pectoris: Secondary | ICD-10-CM | POA: Diagnosis not present

## 2017-01-27 LAB — POCT INR: INR: 2.6

## 2017-01-27 NOTE — Telephone Encounter (Signed)
New Message: ° ° ° ° °Please call. °

## 2017-01-28 DIAGNOSIS — I2510 Atherosclerotic heart disease of native coronary artery without angina pectoris: Secondary | ICD-10-CM | POA: Diagnosis not present

## 2017-01-28 DIAGNOSIS — R1310 Dysphagia, unspecified: Secondary | ICD-10-CM | POA: Diagnosis not present

## 2017-01-28 DIAGNOSIS — I509 Heart failure, unspecified: Secondary | ICD-10-CM | POA: Diagnosis not present

## 2017-01-28 DIAGNOSIS — I4891 Unspecified atrial fibrillation: Secondary | ICD-10-CM | POA: Diagnosis not present

## 2017-01-28 DIAGNOSIS — R296 Repeated falls: Secondary | ICD-10-CM | POA: Diagnosis not present

## 2017-01-28 DIAGNOSIS — I110 Hypertensive heart disease with heart failure: Secondary | ICD-10-CM | POA: Diagnosis not present

## 2017-01-30 DIAGNOSIS — R296 Repeated falls: Secondary | ICD-10-CM | POA: Diagnosis not present

## 2017-01-30 DIAGNOSIS — R1310 Dysphagia, unspecified: Secondary | ICD-10-CM | POA: Diagnosis not present

## 2017-01-30 DIAGNOSIS — I2510 Atherosclerotic heart disease of native coronary artery without angina pectoris: Secondary | ICD-10-CM | POA: Diagnosis not present

## 2017-01-30 DIAGNOSIS — I4891 Unspecified atrial fibrillation: Secondary | ICD-10-CM | POA: Diagnosis not present

## 2017-01-30 DIAGNOSIS — I509 Heart failure, unspecified: Secondary | ICD-10-CM | POA: Diagnosis not present

## 2017-01-30 DIAGNOSIS — I110 Hypertensive heart disease with heart failure: Secondary | ICD-10-CM | POA: Diagnosis not present

## 2017-02-02 DIAGNOSIS — I4891 Unspecified atrial fibrillation: Secondary | ICD-10-CM | POA: Diagnosis not present

## 2017-02-02 DIAGNOSIS — K146 Glossodynia: Secondary | ICD-10-CM | POA: Diagnosis not present

## 2017-02-02 DIAGNOSIS — G4700 Insomnia, unspecified: Secondary | ICD-10-CM | POA: Diagnosis not present

## 2017-02-02 DIAGNOSIS — F419 Anxiety disorder, unspecified: Secondary | ICD-10-CM | POA: Diagnosis not present

## 2017-02-02 DIAGNOSIS — Z780 Asymptomatic menopausal state: Secondary | ICD-10-CM | POA: Diagnosis not present

## 2017-02-02 DIAGNOSIS — I10 Essential (primary) hypertension: Secondary | ICD-10-CM | POA: Diagnosis not present

## 2017-02-02 DIAGNOSIS — R5383 Other fatigue: Secondary | ICD-10-CM | POA: Diagnosis not present

## 2017-02-02 NOTE — Telephone Encounter (Signed)
See coumadin encounter of June 26th

## 2017-02-03 ENCOUNTER — Ambulatory Visit (INDEPENDENT_AMBULATORY_CARE_PROVIDER_SITE_OTHER): Payer: Self-pay | Admitting: Cardiology

## 2017-02-03 DIAGNOSIS — I2510 Atherosclerotic heart disease of native coronary artery without angina pectoris: Secondary | ICD-10-CM | POA: Diagnosis not present

## 2017-02-03 DIAGNOSIS — Z5181 Encounter for therapeutic drug level monitoring: Secondary | ICD-10-CM

## 2017-02-03 DIAGNOSIS — R296 Repeated falls: Secondary | ICD-10-CM | POA: Diagnosis not present

## 2017-02-03 DIAGNOSIS — R1310 Dysphagia, unspecified: Secondary | ICD-10-CM | POA: Diagnosis not present

## 2017-02-03 DIAGNOSIS — I4891 Unspecified atrial fibrillation: Secondary | ICD-10-CM | POA: Diagnosis not present

## 2017-02-03 DIAGNOSIS — I110 Hypertensive heart disease with heart failure: Secondary | ICD-10-CM | POA: Diagnosis not present

## 2017-02-03 DIAGNOSIS — I509 Heart failure, unspecified: Secondary | ICD-10-CM | POA: Diagnosis not present

## 2017-02-03 LAB — POCT INR: INR: 3.8

## 2017-02-04 DIAGNOSIS — I4891 Unspecified atrial fibrillation: Secondary | ICD-10-CM | POA: Diagnosis not present

## 2017-02-04 DIAGNOSIS — R1310 Dysphagia, unspecified: Secondary | ICD-10-CM | POA: Diagnosis not present

## 2017-02-04 DIAGNOSIS — I2510 Atherosclerotic heart disease of native coronary artery without angina pectoris: Secondary | ICD-10-CM | POA: Diagnosis not present

## 2017-02-04 DIAGNOSIS — R296 Repeated falls: Secondary | ICD-10-CM | POA: Diagnosis not present

## 2017-02-04 DIAGNOSIS — I110 Hypertensive heart disease with heart failure: Secondary | ICD-10-CM | POA: Diagnosis not present

## 2017-02-04 DIAGNOSIS — I509 Heart failure, unspecified: Secondary | ICD-10-CM | POA: Diagnosis not present

## 2017-02-05 DIAGNOSIS — R1310 Dysphagia, unspecified: Secondary | ICD-10-CM | POA: Diagnosis not present

## 2017-02-05 DIAGNOSIS — R296 Repeated falls: Secondary | ICD-10-CM | POA: Diagnosis not present

## 2017-02-05 DIAGNOSIS — I4891 Unspecified atrial fibrillation: Secondary | ICD-10-CM | POA: Diagnosis not present

## 2017-02-05 DIAGNOSIS — I110 Hypertensive heart disease with heart failure: Secondary | ICD-10-CM | POA: Diagnosis not present

## 2017-02-05 DIAGNOSIS — I509 Heart failure, unspecified: Secondary | ICD-10-CM | POA: Diagnosis not present

## 2017-02-05 DIAGNOSIS — I2510 Atherosclerotic heart disease of native coronary artery without angina pectoris: Secondary | ICD-10-CM | POA: Diagnosis not present

## 2017-02-06 DIAGNOSIS — I2510 Atherosclerotic heart disease of native coronary artery without angina pectoris: Secondary | ICD-10-CM | POA: Diagnosis not present

## 2017-02-06 DIAGNOSIS — I110 Hypertensive heart disease with heart failure: Secondary | ICD-10-CM | POA: Diagnosis not present

## 2017-02-06 DIAGNOSIS — I4891 Unspecified atrial fibrillation: Secondary | ICD-10-CM | POA: Diagnosis not present

## 2017-02-06 DIAGNOSIS — R1310 Dysphagia, unspecified: Secondary | ICD-10-CM | POA: Diagnosis not present

## 2017-02-06 DIAGNOSIS — I509 Heart failure, unspecified: Secondary | ICD-10-CM | POA: Diagnosis not present

## 2017-02-06 DIAGNOSIS — R296 Repeated falls: Secondary | ICD-10-CM | POA: Diagnosis not present

## 2017-02-10 ENCOUNTER — Ambulatory Visit (INDEPENDENT_AMBULATORY_CARE_PROVIDER_SITE_OTHER): Payer: Self-pay

## 2017-02-10 DIAGNOSIS — Z5181 Encounter for therapeutic drug level monitoring: Secondary | ICD-10-CM

## 2017-02-10 DIAGNOSIS — I110 Hypertensive heart disease with heart failure: Secondary | ICD-10-CM | POA: Diagnosis not present

## 2017-02-10 DIAGNOSIS — I4891 Unspecified atrial fibrillation: Secondary | ICD-10-CM | POA: Diagnosis not present

## 2017-02-10 DIAGNOSIS — I2510 Atherosclerotic heart disease of native coronary artery without angina pectoris: Secondary | ICD-10-CM | POA: Diagnosis not present

## 2017-02-10 DIAGNOSIS — I509 Heart failure, unspecified: Secondary | ICD-10-CM | POA: Diagnosis not present

## 2017-02-10 DIAGNOSIS — R1310 Dysphagia, unspecified: Secondary | ICD-10-CM | POA: Diagnosis not present

## 2017-02-10 DIAGNOSIS — R296 Repeated falls: Secondary | ICD-10-CM | POA: Diagnosis not present

## 2017-02-10 LAB — POCT INR: INR: 2.6

## 2017-02-11 DIAGNOSIS — R1310 Dysphagia, unspecified: Secondary | ICD-10-CM | POA: Diagnosis not present

## 2017-02-11 DIAGNOSIS — I509 Heart failure, unspecified: Secondary | ICD-10-CM | POA: Diagnosis not present

## 2017-02-11 DIAGNOSIS — I110 Hypertensive heart disease with heart failure: Secondary | ICD-10-CM | POA: Diagnosis not present

## 2017-02-11 DIAGNOSIS — I2510 Atherosclerotic heart disease of native coronary artery without angina pectoris: Secondary | ICD-10-CM | POA: Diagnosis not present

## 2017-02-11 DIAGNOSIS — R296 Repeated falls: Secondary | ICD-10-CM | POA: Diagnosis not present

## 2017-02-11 DIAGNOSIS — I4891 Unspecified atrial fibrillation: Secondary | ICD-10-CM | POA: Diagnosis not present

## 2017-02-12 DIAGNOSIS — I509 Heart failure, unspecified: Secondary | ICD-10-CM | POA: Diagnosis not present

## 2017-02-12 DIAGNOSIS — R296 Repeated falls: Secondary | ICD-10-CM | POA: Diagnosis not present

## 2017-02-12 DIAGNOSIS — I4891 Unspecified atrial fibrillation: Secondary | ICD-10-CM | POA: Diagnosis not present

## 2017-02-12 DIAGNOSIS — I2510 Atherosclerotic heart disease of native coronary artery without angina pectoris: Secondary | ICD-10-CM | POA: Diagnosis not present

## 2017-02-12 DIAGNOSIS — I110 Hypertensive heart disease with heart failure: Secondary | ICD-10-CM | POA: Diagnosis not present

## 2017-02-12 DIAGNOSIS — R1310 Dysphagia, unspecified: Secondary | ICD-10-CM | POA: Diagnosis not present

## 2017-02-13 DIAGNOSIS — R296 Repeated falls: Secondary | ICD-10-CM | POA: Diagnosis not present

## 2017-02-13 DIAGNOSIS — R1310 Dysphagia, unspecified: Secondary | ICD-10-CM | POA: Diagnosis not present

## 2017-02-13 DIAGNOSIS — I2510 Atherosclerotic heart disease of native coronary artery without angina pectoris: Secondary | ICD-10-CM | POA: Diagnosis not present

## 2017-02-13 DIAGNOSIS — I110 Hypertensive heart disease with heart failure: Secondary | ICD-10-CM | POA: Diagnosis not present

## 2017-02-13 DIAGNOSIS — I509 Heart failure, unspecified: Secondary | ICD-10-CM | POA: Diagnosis not present

## 2017-02-13 DIAGNOSIS — I4891 Unspecified atrial fibrillation: Secondary | ICD-10-CM | POA: Diagnosis not present

## 2017-02-17 ENCOUNTER — Ambulatory Visit (INDEPENDENT_AMBULATORY_CARE_PROVIDER_SITE_OTHER): Payer: Self-pay

## 2017-02-17 DIAGNOSIS — I4891 Unspecified atrial fibrillation: Secondary | ICD-10-CM

## 2017-02-17 DIAGNOSIS — Z5181 Encounter for therapeutic drug level monitoring: Secondary | ICD-10-CM

## 2017-02-17 DIAGNOSIS — R1310 Dysphagia, unspecified: Secondary | ICD-10-CM | POA: Diagnosis not present

## 2017-02-17 DIAGNOSIS — I2510 Atherosclerotic heart disease of native coronary artery without angina pectoris: Secondary | ICD-10-CM | POA: Diagnosis not present

## 2017-02-17 DIAGNOSIS — I110 Hypertensive heart disease with heart failure: Secondary | ICD-10-CM | POA: Diagnosis not present

## 2017-02-17 DIAGNOSIS — I509 Heart failure, unspecified: Secondary | ICD-10-CM | POA: Diagnosis not present

## 2017-02-17 DIAGNOSIS — R296 Repeated falls: Secondary | ICD-10-CM | POA: Diagnosis not present

## 2017-02-17 LAB — POCT INR: INR: 2.1

## 2017-02-27 ENCOUNTER — Ambulatory Visit (INDEPENDENT_AMBULATORY_CARE_PROVIDER_SITE_OTHER): Payer: Medicare Other | Admitting: *Deleted

## 2017-02-27 DIAGNOSIS — Z5181 Encounter for therapeutic drug level monitoring: Secondary | ICD-10-CM | POA: Diagnosis not present

## 2017-02-27 DIAGNOSIS — I4891 Unspecified atrial fibrillation: Secondary | ICD-10-CM | POA: Diagnosis not present

## 2017-02-27 DIAGNOSIS — J069 Acute upper respiratory infection, unspecified: Secondary | ICD-10-CM | POA: Diagnosis not present

## 2017-02-27 LAB — POCT INR: INR: 2

## 2017-03-03 ENCOUNTER — Other Ambulatory Visit: Payer: Self-pay | Admitting: Interventional Cardiology

## 2017-03-04 ENCOUNTER — Ambulatory Visit (INDEPENDENT_AMBULATORY_CARE_PROVIDER_SITE_OTHER): Payer: Medicare Other | Admitting: *Deleted

## 2017-03-04 ENCOUNTER — Telehealth: Payer: Self-pay | Admitting: Cardiology

## 2017-03-04 ENCOUNTER — Ambulatory Visit: Payer: Medicare Other | Admitting: *Deleted

## 2017-03-04 DIAGNOSIS — R001 Bradycardia, unspecified: Secondary | ICD-10-CM

## 2017-03-04 NOTE — Telephone Encounter (Signed)
Confirmed remote transmission w/ pt husband.   

## 2017-03-10 ENCOUNTER — Telehealth: Payer: Self-pay | Admitting: Interventional Cardiology

## 2017-03-10 NOTE — Telephone Encounter (Signed)
Error

## 2017-03-12 ENCOUNTER — Encounter: Payer: Self-pay | Admitting: Cardiology

## 2017-03-12 NOTE — Progress Notes (Signed)
Remote pacemaker transmission.   

## 2017-03-13 ENCOUNTER — Ambulatory Visit (INDEPENDENT_AMBULATORY_CARE_PROVIDER_SITE_OTHER): Payer: Medicare Other | Admitting: *Deleted

## 2017-03-13 DIAGNOSIS — I4891 Unspecified atrial fibrillation: Secondary | ICD-10-CM | POA: Diagnosis not present

## 2017-03-13 DIAGNOSIS — Z5181 Encounter for therapeutic drug level monitoring: Secondary | ICD-10-CM

## 2017-03-13 LAB — POCT INR: INR: 2.2

## 2017-03-17 ENCOUNTER — Telehealth: Payer: Self-pay | Admitting: Interventional Cardiology

## 2017-03-17 NOTE — Telephone Encounter (Signed)
New message    Juliette AlcideMelinda is calling to find out if home health orders are in chart. She said they did not receive them back. Please call.

## 2017-03-17 NOTE — Telephone Encounter (Signed)
[name] is calling to see if our office received the orders she faxed this morning for dates of 01/27/17 PT/INR & 02/03/17 PT/INR.  Faxed orders received this morning.

## 2017-03-22 NOTE — Progress Notes (Signed)
Cardiology Office Note   Date:  03/23/2017   ID:  [name], DOB 1919-02-03, MRN [id number]  PCP:  Asencion Gowda.August Saucer, MD    No chief complaint on file. AFib   Wt Readings from Last 3 Encounters:  03/23/17 127 lb 6.4 oz (57.8 kg)  10/02/16 127 lb (57.6 kg)  09/03/16 128 lb 6.4 oz (58.2 kg)       History of Present Illness: [name] is a 83 y.o. female   who has had AFib and HTN.   She has been hospitalized for fluid overload in the past. She spent about 2 days in the hospital. She was diuresed. She has been on a more consistent dose of Lasix. She weighs yourself daily. She minimizes salt. Overall, she has done very well since being in the hospital.   She moved in with her daughter.  Her husband is at [facility].    No falls or bleeding problems.    Denies : Chest pain. Dizziness. Leg edema. Nitroglycerin use. Orthopnea. Palpitations. Paroxysmal nocturnal dyspnea. Shortness of breath. Syncope.      Past Medical History:  Diagnosis Date  . Anxiety   . Atrial fibrillation (HCC)   . Burning mouth syndrome   . CAD (coronary artery disease)   . Coronary atherosclerosis of native coronary artery   . Dysphagia   . GERD (gastroesophageal reflux disease)   . HTN (hypertension)   . Insomnia     Past Surgical History:  Procedure Laterality Date  . BREAST LUMPECTOMY    . KIDNEY STONE SURGERY    . TONSILLECTOMY       Current Outpatient Prescriptions  Medication Sig Dispense Refill  . Ascorbic Acid (VITAMIN C PO) Take 1 tablet by mouth daily.    Marland Kitchen CALCIUM-VITAMIN D PO Take 1 tablet by mouth daily.    . CHOLECALCIFEROL PO Take 1 tablet by mouth daily.    . clonazePAM (KLONOPIN) 0.5 MG tablet Take 0.5 mg by mouth daily.    . Coenzyme Q10 (COQ10 PO) Take 1 capsule by mouth daily.    . furosemide (LASIX) 40 MG tablet Take 1 tablet (40 mg total) by mouth daily. May take 1 additional tablet daily for increased weight/SOB and edema as directed. 135 tablet 2   . losartan (COZAAR) 25 MG tablet TAKE ONE TABLET BY MOUTH ONCE DAILY 90 tablet 1  . LYRICA 50 MG capsule Take 2 capsules by mouth 2 (two) times daily.     Marland Kitchen MAGNESIUM PO Take 1 tablet by mouth 2 (two) times daily.    . medroxyPROGESTERone (PROVERA) 10 MG tablet Take 1 tablet by mouth daily.    . Omega-3 Fatty Acids (FISH OIL PO) Take 1 capsule by mouth 2 (two) times daily.    . potassium chloride SA (KLOR-CON M20) 20 MEQ tablet Take 1 tablet (20 mEq total) by mouth daily. 90 tablet 3  . PREMARIN 0.625 MG tablet Take 0.625 mg by mouth daily.    Marland Kitchen TAZTIA XT 300 MG 24 hr capsule TAKE ONE CAPSULE BY MOUTH ONCE DAILY 90 capsule 2  . VITAMIN A PO Take 1 capsule by mouth daily.    Marland Kitchen VITAMIN E PO Take 1 capsule by mouth daily.    Marland Kitchen warfarin (COUMADIN) 2.5 MG tablet USE AS DIRECTED 50 tablet 3   No current facility-administered medications for this visit.     Allergies:   Cortisone and Metoprolol    Social History:  The patient  reports that  she has never smoked. She has never used smokeless tobacco. She reports that she does not drink alcohol or use drugs.   Family History:  The patient's family history includes Bladder Cancer in her brother, father, and paternal grandfather; Hypertension in her father; Leukemia in her mother; Liver cancer in her sister.    ROS:  Please see the history of present illness.   Otherwise, review of systems are positive for ankle swelling, R>L.   All other systems are reviewed and negative.    PHYSICAL EXAM: VS:  BP (!) 122/56   Pulse 98   Ht 5\' 1"  (1.549 m)   Wt 127 lb 6.4 oz (57.8 kg)   SpO2 97%   BMI 24.07 kg/m  , BMI Body mass index is 24.07 kg/m. GEN: Well nourished, well developed, in no acute distress  HEENT: normal  Neck: no JVD, carotid bruits, or masses Cardiac: irregularly irregular; no murmurs, rubs, or gallops; bilateral ankle edema R>L Respiratory:  clear to auscultation bilaterally, normal work of breathing GI: soft, nontender,  nondistended, + BS MS: no deformity or atrophy  Skin: warm and dry, no rash Neuro:  Strength and sensation are intact Psych: euthymic mood, full affect    Recent Labs: No results found for requested labs within last 8760 hours.   Lipid Panel No results found for: CHOL, TRIG, HDL, CHOLHDL, VLDL, LDLCALC, LDLDIRECT   Other studies Reviewed: Additional studies/ records that were reviewed today with results demonstrating: Cr. 0.9 in 5/18.   ASSESSMENT AND PLAN:  1. Atrial fibrillation: Rate controlled. Warfarin for stroke prevention. 2. HTN: Well controlled. Continue current medications. 3. Edema/Chronic diastolic heart failure: She appears euvolemic. Continue current diuretics. Elevate legs when possible. Breathing is stable. 4. Pacer: Continue follow-up with Dr. Ladona Ridgel. 5. Skin discoloration: Started with Amio.  Likely associated with venous insufficiency as well.    Current medicines are reviewed at length with the patient today.  The patient concerns regarding her medicines were addressed.  The following changes have been made:  No change  Labs/ tests ordered today include:  No orders of the defined types were placed in this encounter.   Recommend 150 minutes/week of aerobic exercise Low fat, low carb, high fiber diet recommended  Disposition:   FU about 6 months after she sees Dr. Ladona Ridgel.   Signed, Lance Muss, MD  03/23/2017 11:36 AM    [facility] [address] [place], [place], [place]  [address] Phone: [phone]; Fax: [phone]

## 2017-03-23 ENCOUNTER — Ambulatory Visit (INDEPENDENT_AMBULATORY_CARE_PROVIDER_SITE_OTHER): Payer: Medicare Other | Admitting: Interventional Cardiology

## 2017-03-23 ENCOUNTER — Encounter: Payer: Self-pay | Admitting: Interventional Cardiology

## 2017-03-23 VITALS — BP 122/56 | HR 98 | Ht 61.0 in | Wt 127.4 lb

## 2017-03-23 DIAGNOSIS — I4891 Unspecified atrial fibrillation: Secondary | ICD-10-CM | POA: Diagnosis not present

## 2017-03-23 DIAGNOSIS — I5032 Chronic diastolic (congestive) heart failure: Secondary | ICD-10-CM

## 2017-03-23 DIAGNOSIS — Z950 Presence of cardiac pacemaker: Secondary | ICD-10-CM | POA: Diagnosis not present

## 2017-03-23 DIAGNOSIS — R600 Localized edema: Secondary | ICD-10-CM

## 2017-03-23 DIAGNOSIS — I10 Essential (primary) hypertension: Secondary | ICD-10-CM | POA: Diagnosis not present

## 2017-03-23 NOTE — Patient Instructions (Signed)
Medication Instructions:  Your physician recommends that you continue on your current medications as directed. Please refer to the Current Medication list given to you today.   Labwork: None ordered  Testing/Procedures: None ordered  Follow-Up: Your physician wants you to follow-up in: June 2019 with Dr. Varanasi. You will receive a reminder letter in the mail two months in advance. If you don't receive a letter, please call our office to schedule the follow-up appointment.   Any Other Special Instructions Will Be Listed Below (If Applicable).     If you need a refill on your cardiac medications before your next appointment, please call your pharmacy.   

## 2017-03-27 ENCOUNTER — Encounter: Payer: Self-pay | Admitting: Pharmacist

## 2017-04-03 ENCOUNTER — Ambulatory Visit (INDEPENDENT_AMBULATORY_CARE_PROVIDER_SITE_OTHER): Payer: Medicare Other | Admitting: *Deleted

## 2017-04-03 DIAGNOSIS — Z5181 Encounter for therapeutic drug level monitoring: Secondary | ICD-10-CM

## 2017-04-03 DIAGNOSIS — I4891 Unspecified atrial fibrillation: Secondary | ICD-10-CM

## 2017-04-03 LAB — POCT INR: INR: 2.7

## 2017-04-24 ENCOUNTER — Other Ambulatory Visit: Payer: Self-pay | Admitting: Interventional Cardiology

## 2017-04-25 ENCOUNTER — Ambulatory Visit (HOSPITAL_COMMUNITY)
Admission: EM | Admit: 2017-04-25 | Discharge: 2017-04-25 | Disposition: A | Discharge status: Home / Self Care | Payer: Medicare Other | Attending: Family Medicine | Admitting: Family Medicine

## 2017-04-25 ENCOUNTER — Encounter (HOSPITAL_COMMUNITY): Payer: Self-pay | Admitting: Family Medicine

## 2017-04-25 DIAGNOSIS — H9203 Otalgia, bilateral: Secondary | ICD-10-CM

## 2017-04-25 NOTE — ED Triage Notes (Signed)
Pt here for bilateral ear pain.  

## 2017-04-27 LAB — CUP PACEART REMOTE DEVICE CHECK
Battery Impedance: 1052 Ohm
Battery Voltage: 2.77 V
Brady Statistic RV Percent Paced: 28 %
Date Time Interrogation Session: 20180802222120
Implantable Lead Implant Date: 20111202
Implantable Lead Location: 753859
Implantable Lead Model: 5076
Implantable Pulse Generator Implant Date: 20111202
Lead Channel Impedance Value: 67 Ohm
Lead Channel Setting Pacing Amplitude: 2.5 V
Lead Channel Setting Pacing Pulse Width: 0.4 ms
Lead Channel Setting Sensing Sensitivity: 4 mV
MDC IDC LEAD IMPLANT DT: 20111202
MDC IDC LEAD LOCATION: 753860
MDC IDC MSMT BATTERY REMAINING LONGEVITY: 61 mo
MDC IDC MSMT LEADCHNL RV IMPEDANCE VALUE: 504 Ohm
MDC IDC MSMT LEADCHNL RV PACING THRESHOLD AMPLITUDE: 0.625 V
MDC IDC MSMT LEADCHNL RV PACING THRESHOLD PULSEWIDTH: 0.4 ms

## 2017-04-27 NOTE — ED Provider Notes (Signed)
  [facility]   [id number] 04/25/17 Arrival Time: 1519  ASSESSMENT & PLAN:  1. Otalgia of both ears    Possibly allergy related. Will try OTC anti-histamine for a week or two and f/u if not improving. OTC analgesics and symptom care as needed. Requests INR. On Warfarin secondary to blood clot. Reviewed expectations re: course of current medical issues. Questions answered. Outlined signs and symptoms indicating need for more acute intervention. Patient verbalized understanding. After Visit Summary given.   SUBJECTIVE:  [name] is a 83 y.o. female who presents with complaint of bilateral ear "fullness" for the past several days. No otalgia. No ear drainage. Hearing normally. Some nasal congestion and sneezing recently. Afebrile. No OTC treatment. ROS: As per HPI.   OBJECTIVE:  Vitals:   04/25/17 1643  BP: (!) 134/49  Pulse: 82  Resp: 18  Temp: 98.1 F (36.7 C)  SpO2: 100%     General appearance: alert; no distress HEENT: nasal congestion; TMs with clear fluid; no erythema or signs of infection Neck: supple without LAD Lungs: clear to auscultation bilaterally Skin: warm and dry Psychological: alert and cooperative; normal mood and affect  Results for orders placed or performed in visit on 04/03/17  POCT INR  Result Value Ref Range   INR 2.7     No results found.  Allergies  Allergen Reactions  . Cortisone Other (See Comments)    REACTION: "STOMACH BURNS"  . Metoprolol Diarrhea    Past Medical History:  Diagnosis Date  . Anxiety   . Atrial fibrillation (HCC)   . Burning mouth syndrome   . CAD (coronary artery disease)   . Coronary atherosclerosis of native coronary artery   . Dysphagia   . GERD (gastroesophageal reflux disease)   . HTN (hypertension)   . Insomnia    Social History   Social History  . Marital status: Married    Spouse name: N/A  . Number of children: N/A  . Years of education: N/A   Occupational History  .  Not on file.   Social History Main Topics  . Smoking status: Never Smoker  . Smokeless tobacco: Never Used  . Alcohol use No  . Drug use: No  . Sexual activity: Not on file   Other Topics Concern  . Not on file   Social History Narrative  . No narrative on file   Family History  Problem Relation Age of Onset  . Liver cancer Sister   . Leukemia Mother        ACUTE  . Bladder Cancer Father   . Hypertension Father   . Bladder Cancer Brother   . Bladder Cancer Paternal Grandfather   . Heart attack Neg Hx   . Stroke Neg Hx            Mardella Layman, MD 04/27/17 [phone]

## 2017-05-12 ENCOUNTER — Ambulatory Visit (INDEPENDENT_AMBULATORY_CARE_PROVIDER_SITE_OTHER): Payer: Medicare Other | Admitting: *Deleted

## 2017-05-12 DIAGNOSIS — Z5181 Encounter for therapeutic drug level monitoring: Secondary | ICD-10-CM

## 2017-05-12 DIAGNOSIS — I4891 Unspecified atrial fibrillation: Secondary | ICD-10-CM

## 2017-05-12 DIAGNOSIS — Z23 Encounter for immunization: Secondary | ICD-10-CM | POA: Diagnosis not present

## 2017-05-12 LAB — POCT INR: INR: 2.9

## 2017-05-25 DIAGNOSIS — T148XXA Other injury of unspecified body region, initial encounter: Secondary | ICD-10-CM | POA: Diagnosis not present

## 2017-06-11 ENCOUNTER — Encounter: Payer: Medicare Other | Admitting: *Deleted

## 2017-06-11 ENCOUNTER — Telehealth: Payer: Self-pay | Admitting: Cardiology

## 2017-06-11 NOTE — Telephone Encounter (Signed)
Confirmed remote transmission w/ pt husband. Pt husband stated that pt lives w/ her daughter now and the next two days is going to be really busy b/c they are planning a big wedding and that it would be best to reschedule the remote appt to next week. Pt husband said 06-18-2017 would be a great time to do it.

## 2017-06-12 ENCOUNTER — Encounter: Payer: Self-pay | Admitting: Cardiology

## 2017-06-16 ENCOUNTER — Ambulatory Visit (INDEPENDENT_AMBULATORY_CARE_PROVIDER_SITE_OTHER): Payer: Medicare Other | Admitting: *Deleted

## 2017-06-16 DIAGNOSIS — I4891 Unspecified atrial fibrillation: Secondary | ICD-10-CM | POA: Diagnosis not present

## 2017-06-16 DIAGNOSIS — Z5181 Encounter for therapeutic drug level monitoring: Secondary | ICD-10-CM | POA: Diagnosis not present

## 2017-06-16 LAB — PROTIME-INR
INR: 6.2 (ref 0.8–1.2)
PROTHROMBIN TIME: 65.3 s — AB (ref 9.1–12.0)

## 2017-06-16 LAB — POCT INR: INR: 6.8

## 2017-06-16 NOTE — Patient Instructions (Addendum)
Pt has taken coumadin today Stat Venipuncture No more coumadin until called Monitor for any bleeding and go to ER if seen  Do serving of greens today if possible Venipuncture INR was 6.2  Spoke with pt's husband and instructed to hold coumadin Wednesday Nov 14th no coumadin on Thursday Nov 15th and no coumadin on Friday Nov 16th then continue same dose 1.5 tablets daily Recheck INR on Monday Nov 19th Continue to monitor for any bleeding and go to ER if seen  Do couple servings of dark leafy greens over the next few days

## 2017-06-18 ENCOUNTER — Other Ambulatory Visit: Payer: Self-pay

## 2017-06-18 MED ORDER — FUROSEMIDE 40 MG PO TABS: 40.0000 mg | ORAL_TABLET | Freq: Every day | ORAL | 2 refills | Status: DC

## 2017-06-22 ENCOUNTER — Ambulatory Visit (INDEPENDENT_AMBULATORY_CARE_PROVIDER_SITE_OTHER): Payer: Medicare Other | Admitting: *Deleted

## 2017-06-22 DIAGNOSIS — I4891 Unspecified atrial fibrillation: Secondary | ICD-10-CM

## 2017-06-22 DIAGNOSIS — Z5181 Encounter for therapeutic drug level monitoring: Secondary | ICD-10-CM | POA: Diagnosis not present

## 2017-06-22 LAB — POCT INR: INR: 1.6

## 2017-06-22 NOTE — Patient Instructions (Signed)
Today take another 1/2 tablet (since you have taken 1.5 tablets, take only 1/2 tablet more) then continue same dose 1.5 tablets daily.  Recheck INR in 1 week. [facility] [phone]

## 2017-06-25 IMAGING — DX DG CHEST 2V
2 series · 2 of 2 positions shown · non-contrast
Comparison: 05/21/2012

CLINICAL DATA: Shortness of breath since [REDACTED].

EXAM:
CHEST  2 VIEW

[chest pa]
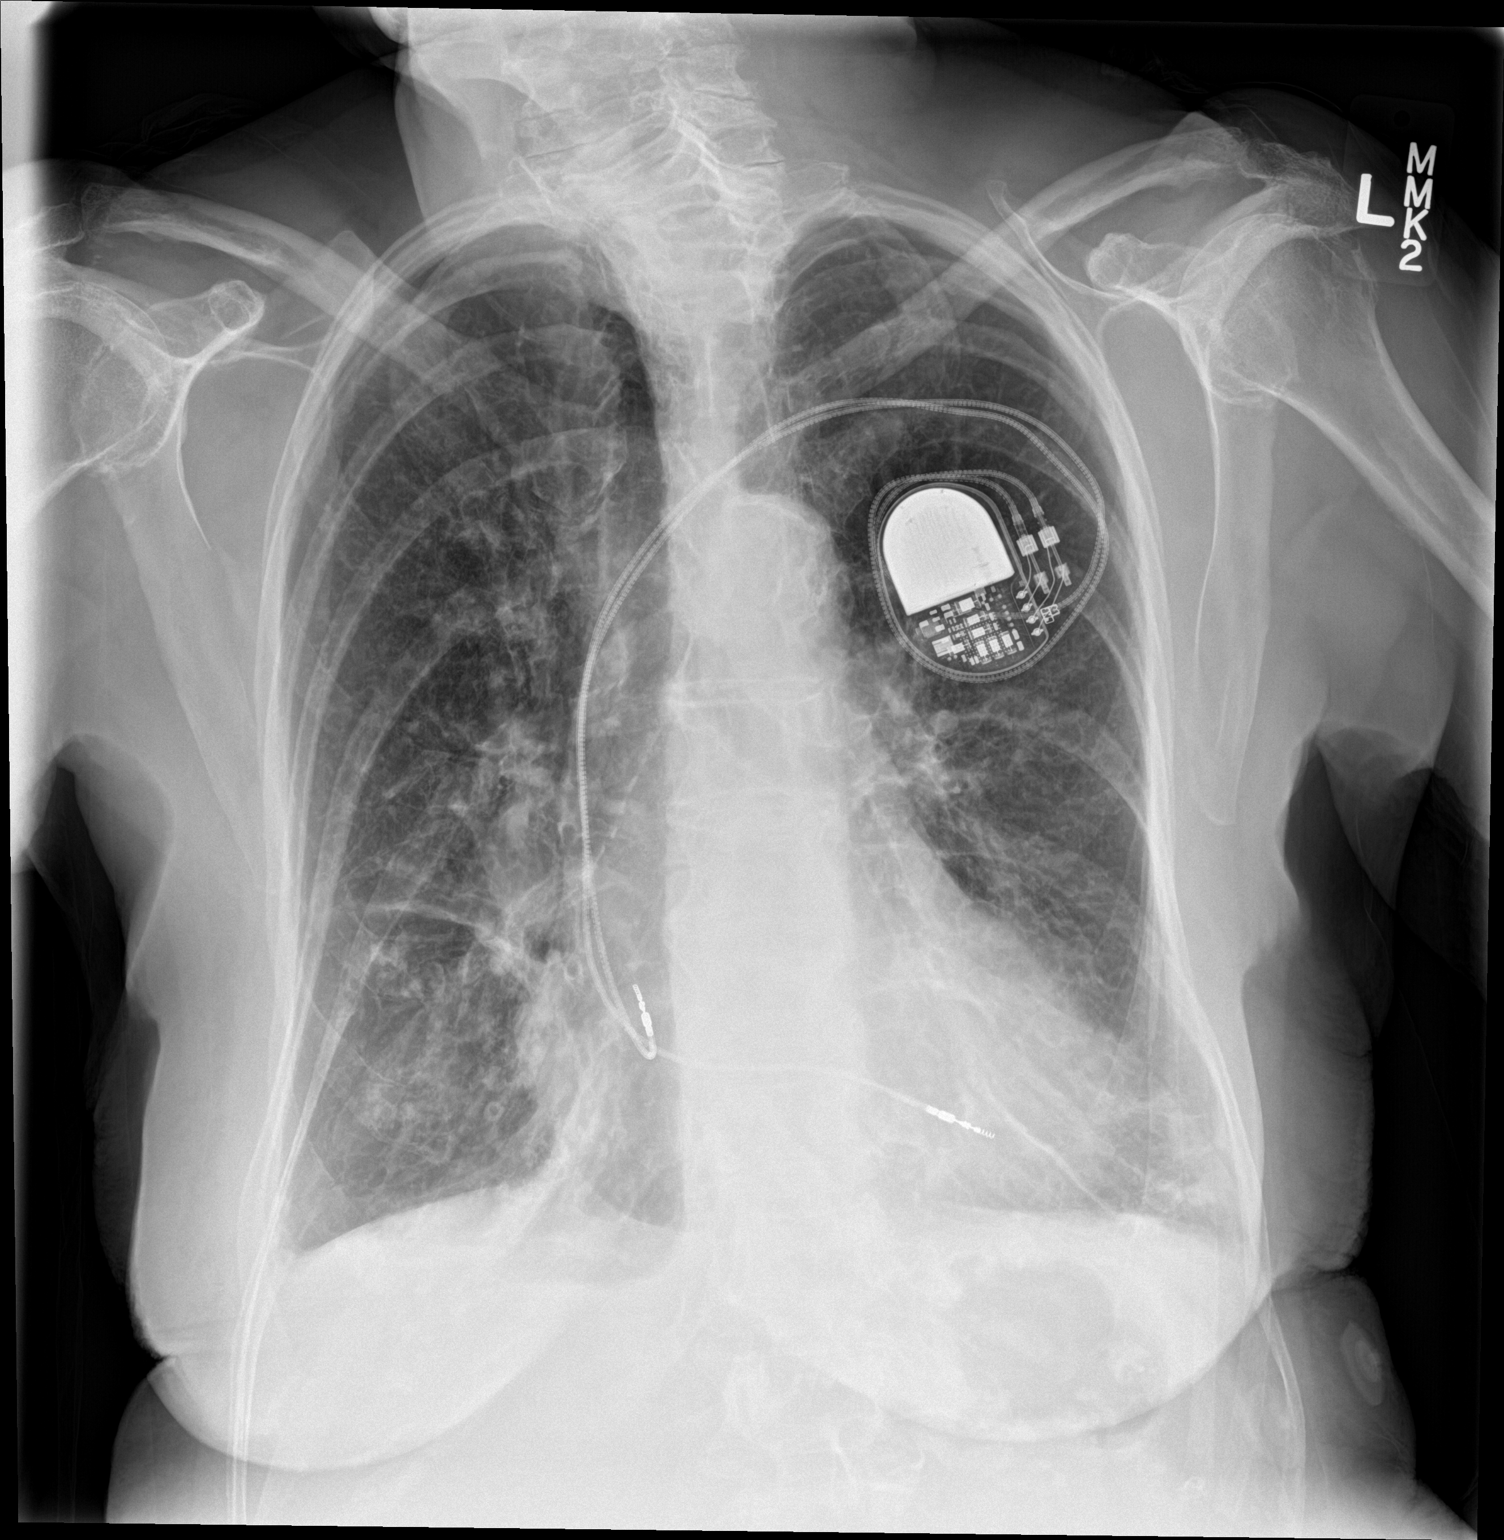

[chest lat]
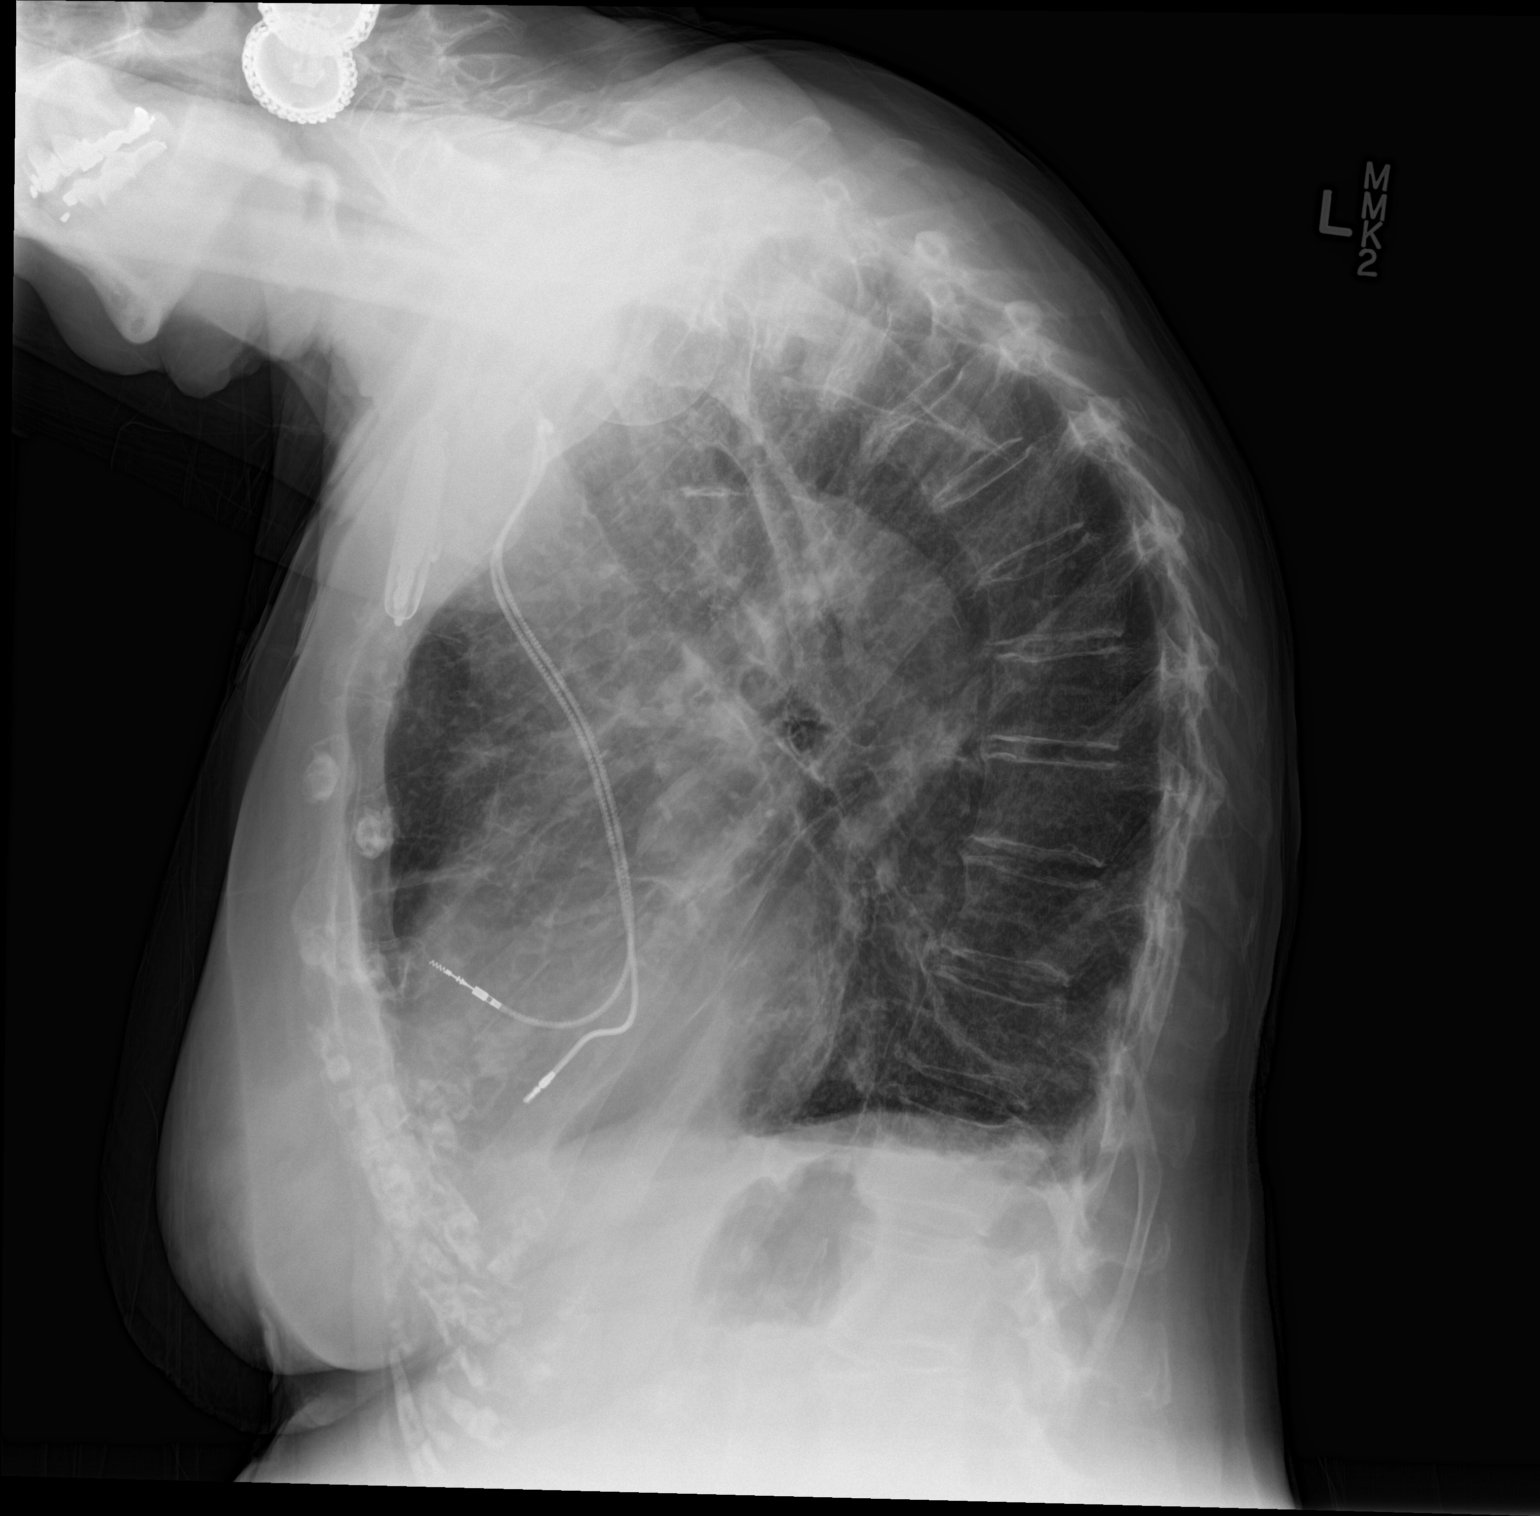

[2 of 2 positions shown; findings below may reference images not displayed]

FINDINGS: The heart is enlarged but stable. The pacer wires are stable. There
is tortuosity and calcification of the thoracic aorta. The lungs
demonstrate severe chronic bronchitic and emphysematous changes but
no acute pulmonary infiltrates, effusions or edema. No acute bony
findings. Stable remote mid thoracic compression deformity.
IMPRESSION: Chronic emphysematous and bronchitic lung changes without definite
acute overlying pulmonary process.

## 2017-07-01 ENCOUNTER — Ambulatory Visit (INDEPENDENT_AMBULATORY_CARE_PROVIDER_SITE_OTHER): Payer: Medicare Other | Admitting: *Deleted

## 2017-07-01 DIAGNOSIS — I4891 Unspecified atrial fibrillation: Secondary | ICD-10-CM

## 2017-07-01 DIAGNOSIS — Z5181 Encounter for therapeutic drug level monitoring: Secondary | ICD-10-CM | POA: Diagnosis not present

## 2017-07-01 LAB — POCT INR: INR: 3

## 2017-07-01 NOTE — Patient Instructions (Addendum)
Continue taking 1.5 tablets daily.  Have a dark green leafy veggie today & remain consistent.  Recheck INR in 2 weeks. [facility] [phone]

## 2017-07-02 ENCOUNTER — Other Ambulatory Visit: Payer: Self-pay | Admitting: Interventional Cardiology

## 2017-07-11 ENCOUNTER — Ambulatory Visit (HOSPITAL_COMMUNITY)
Admission: EM | Admit: 2017-07-11 | Discharge: 2017-07-11 | Disposition: A | Discharge status: Home / Self Care | Payer: Medicare Other | Attending: Internal Medicine | Admitting: Internal Medicine

## 2017-07-11 ENCOUNTER — Encounter (HOSPITAL_COMMUNITY): Payer: Self-pay | Admitting: *Deleted

## 2017-07-11 DIAGNOSIS — F419 Anxiety disorder, unspecified: Secondary | ICD-10-CM | POA: Insufficient documentation

## 2017-07-11 DIAGNOSIS — I110 Hypertensive heart disease with heart failure: Secondary | ICD-10-CM | POA: Diagnosis not present

## 2017-07-11 DIAGNOSIS — Z79899 Other long term (current) drug therapy: Secondary | ICD-10-CM | POA: Diagnosis not present

## 2017-07-11 DIAGNOSIS — K219 Gastro-esophageal reflux disease without esophagitis: Secondary | ICD-10-CM | POA: Diagnosis not present

## 2017-07-11 DIAGNOSIS — I2510 Atherosclerotic heart disease of native coronary artery without angina pectoris: Secondary | ICD-10-CM | POA: Diagnosis not present

## 2017-07-11 DIAGNOSIS — I5032 Chronic diastolic (congestive) heart failure: Secondary | ICD-10-CM | POA: Diagnosis not present

## 2017-07-11 DIAGNOSIS — N390 Urinary tract infection, site not specified: Secondary | ICD-10-CM | POA: Insufficient documentation

## 2017-07-11 DIAGNOSIS — R300 Dysuria: Secondary | ICD-10-CM

## 2017-07-11 DIAGNOSIS — Z7901 Long term (current) use of anticoagulants: Secondary | ICD-10-CM | POA: Diagnosis not present

## 2017-07-11 DIAGNOSIS — I4891 Unspecified atrial fibrillation: Secondary | ICD-10-CM | POA: Diagnosis not present

## 2017-07-11 DIAGNOSIS — Z950 Presence of cardiac pacemaker: Secondary | ICD-10-CM | POA: Diagnosis not present

## 2017-07-11 LAB — POCT URINALYSIS DIP (DEVICE)
Bilirubin Urine: NEGATIVE
GLUCOSE, UA: NEGATIVE mg/dL
Ketones, ur: NEGATIVE mg/dL
Nitrite: NEGATIVE
PROTEIN: NEGATIVE mg/dL
SPECIFIC GRAVITY, URINE: 1.01 (ref 1.005–1.030)
UROBILINOGEN UA: 0.2 mg/dL (ref 0.0–1.0)
pH: 6 (ref 5.0–8.0)

## 2017-07-11 MED ORDER — CEPHALEXIN 500 MG PO CAPS
500.0000 mg | ORAL_CAPSULE | Freq: Four times a day (QID) | ORAL | 0 refills | Status: DC
Start: 2017-07-11 — End: 2017-08-31

## 2017-07-11 MED ORDER — FLUCONAZOLE 150 MG PO TABS: ORAL_TABLET | ORAL | 0 refills | Status: DC

## 2017-07-11 NOTE — Discharge Instructions (Signed)
Take the antibiotic as directed. Your urine will be cultured. This will grow out any germs causing an infection. Take the medication as directed. If worse may return.

## 2017-07-11 NOTE — ED Triage Notes (Signed)
Patient reports dysuria that started this am. Denies abdominal pain. No fevers.

## 2017-07-11 NOTE — ED Provider Notes (Signed)
[facility]    CSN: [id number] Arrival date & time: 07/11/17  1653     History   Chief Complaint Chief Complaint  Patient presents with  . Dysuria    HPI [name] is a 83 y.o. female.   83 year old well-preserved female complaining of burning with urination and some frequency. Symptoms started about 2 days ago. She has been drinking increased amounts of water. Denies other symptoms.  Via systems Denies chest pain, shortness of breath, abdominal pain, nausea or vomiting, flank pain, vaginal bleeding, myalgias, fever or chills.      Past Medical History:  Diagnosis Date  . Anxiety   . Atrial fibrillation (HCC)   . Burning mouth syndrome   . CAD (coronary artery disease)   . Coronary atherosclerosis of native coronary artery   . Dysphagia   . GERD (gastroesophageal reflux disease)   . HTN (hypertension)   . Insomnia     Patient Active Problem List   Diagnosis Date Noted  . Chronic diastolic heart failure (HCC) 09/20/2015  . CHF (congestive heart failure) (HCC) 08/28/2015  . Dyspnea 08/26/2015  . Fatigue 08/26/2015  . Leg edema 08/14/2014  . Cardiac pacemaker in situ 01/10/2014  . Encounter for therapeutic drug monitoring 09/06/2013  . HTN (hypertension)   . Anxiety   . GERD (gastroesophageal reflux disease)   . Insomnia   . Coronary atherosclerosis of native coronary artery   . Dysphagia   . CAD (coronary artery disease)   . Burning mouth syndrome   . Atrial fibrillation (HCC) 05/06/2013    Past Surgical History:  Procedure Laterality Date  . BREAST LUMPECTOMY    . KIDNEY STONE SURGERY    . TONSILLECTOMY      OB History    No data available       Home Medications    Prior to Admission medications   Medication Sig Start Date End Date Taking? Authorizing Provider  Ascorbic Acid (VITAMIN C PO) Take 1 tablet by mouth daily.    [provider]  CALCIUM-VITAMIN D PO Take 1 tablet by mouth daily.    [provider]  cephALEXin (KEFLEX) 500 MG capsule Take 1 capsule (500 mg total) by mouth 4 (four) times daily. 07/11/17   Hayden RasmussenMabe, Naydene Kamrowski, NP  CHOLECALCIFEROL PO Take 1 tablet by mouth daily.    [provider]  clonazePAM (KLONOPIN) 0.5 MG tablet Take 0.5 mg by mouth daily. 08/01/15   [provider]  Coenzyme Q10 (COQ10 PO) Take 1 capsule by mouth daily.    [provider]  diltiazem (TIAZAC) 300 MG 24 hr capsule TAKE 1 CAPSULE BY MOUTH ONCE DAILY 07/02/17   Corky CraftsVaranasi, Jayadeep S, MD  fluconazole (DIFLUCAN) 150 MG tablet 1 tab po x 1. May repeat in 72 hours if no improvement 07/11/17   Hayden RasmussenMabe, Sandon Yoho, NP  furosemide (LASIX) 40 MG tablet Take 1 tablet (40 mg total) daily by mouth. May take 1 additional tablet daily for increased weight/SOB and edema as directed. 06/18/17   Corky CraftsVaranasi, Jayadeep S, MD  losartan (COZAAR) 25 MG tablet TAKE 1 TABLET BY MOUTH ONCE DAILY 04/24/17   Corky CraftsVaranasi, Jayadeep S, MD  LYRICA 50 MG capsule Take 2 capsules by mouth 2 (two) times daily.  04/26/13   [provider]  MAGNESIUM PO Take 1 tablet by mouth 2 (two) times daily.    [provider]  medroxyPROGESTERone (PROVERA) 10 MG tablet Take 1 tablet by mouth daily. 04/21/13   [provider]  Omega-3 Fatty Acids (FISH OIL PO) Take 1 capsule by mouth 2 (two) times daily.    [provider]  potassium chloride SA (KLOR-CON M20) 20 MEQ tablet Take 1 tablet (20 mEq total) by mouth daily. 12/23/16   Marinus Mawaylor, Gregg W, MD  PREMARIN 0.625 MG tablet Take 0.625 mg by mouth daily. 08/28/15   [provider]  VITAMIN A PO Take 1 capsule by mouth daily.    [provider]  VITAMIN E PO Take 1 capsule by mouth daily.    [provider]  warfarin (COUMADIN) 2.5 MG tablet USE AS DIRECTED 03/03/17   Corky CraftsVaranasi, Jayadeep S, MD    Family History Family History  Problem Relation Age of Onset  . Liver cancer Sister   . Leukemia Mother        ACUTE  . Bladder Cancer Father   .  Hypertension Father   . Bladder Cancer Brother   . Bladder Cancer Paternal Grandfather   . Heart attack Neg Hx   . Stroke Neg Hx     Social History Social History   Tobacco Use  . Smoking status: Never Smoker  . Smokeless tobacco: Never Used  Substance Use Topics  . Alcohol use: No    Alcohol/week: 0.0 oz  . Drug use: No     Allergies   Cortisone and Metoprolol   Review of Systems Review of Systems  All other systems reviewed and are negative.    Physical Exam Triage Vital Signs ED Triage Vitals [07/11/17 1813]  Enc Vitals Group     BP 121/78     Pulse Rate 89     Resp 17     Temp 98.1 F (36.7 C)     Temp Source Oral     SpO2 98 %     Weight      Height      Head Circumference      Peak Flow      Pain Score      Pain Loc      Pain Edu?      Excl. in GC?    No data found.  Updated Vital Signs BP 121/78   Pulse 89   Temp 98.1 F (36.7 C) (Oral)   Resp 17   SpO2 98%   Visual Acuity Right Eye Distance:   Left Eye Distance:   Bilateral Distance:    Right Eye Near:   Left Eye Near:    Bilateral Near:     Physical Exam  Constitutional: She is oriented to person, place, and time. She appears well-developed and well-nourished. No distress.  Eyes: EOM are normal.  Neck: Neck supple.  Cardiovascular: Normal rate.  Pulmonary/Chest: Effort normal. No respiratory distress.  Musculoskeletal: She exhibits no edema.  Neurological: She is alert and oriented to person, place, and time. She exhibits normal muscle tone.  Skin: Skin is warm and dry.  Psychiatric: She has a normal mood and affect.  Nursing note and vitals reviewed.    UC Treatments / Results  Labs (all labs ordered are listed, but only abnormal results are displayed) Labs Reviewed  POCT URINALYSIS DIP (DEVICE) - Abnormal; Notable for the following components:      Result Value   Hgb urine dipstick MODERATE (*)    Leukocytes, UA TRACE (*)    All other components within normal limits   URINE CULTURE    EKG  EKG Interpretation None       Radiology No  results found.  Procedures Procedures (including critical care time)  Medications Ordered in UC Medications - No data to display   Initial Impression / Assessment and Plan / UC Course  I have reviewed the triage vital signs and the nursing notes.  Pertinent labs & imaging results that were available during my care of the patient were reviewed by me and considered in my medical decision making (see chart for details).    Take the antibiotic as directed. Your urine will be cultured. This will grow out any germs causing an infection. Take the medication as directed. If worse may return.     Final Clinical Impressions(s) / UC Diagnoses   Final diagnoses:  Lower urinary tract infectious disease  Dysuria    ED Discharge Orders        Ordered    cephALEXin (KEFLEX) 500 MG capsule  4 times daily     07/11/17 1829    fluconazole (DIFLUCAN) 150 MG tablet     07/11/17 1830       Controlled Substance Prescriptions [place] Controlled Substance Registry consulted? Not Applicable   Hayden Rasmussen, NP 07/11/17 [phone]

## 2017-07-13 LAB — URINE CULTURE

## 2017-07-25 ENCOUNTER — Other Ambulatory Visit: Payer: Self-pay | Admitting: Interventional Cardiology

## 2017-07-27 DIAGNOSIS — R300 Dysuria: Secondary | ICD-10-CM | POA: Diagnosis not present

## 2017-08-03 ENCOUNTER — Ambulatory Visit (INDEPENDENT_AMBULATORY_CARE_PROVIDER_SITE_OTHER): Payer: Medicare Other | Admitting: *Deleted

## 2017-08-03 DIAGNOSIS — Z5181 Encounter for therapeutic drug level monitoring: Secondary | ICD-10-CM | POA: Diagnosis not present

## 2017-08-03 DIAGNOSIS — I4891 Unspecified atrial fibrillation: Secondary | ICD-10-CM | POA: Diagnosis not present

## 2017-08-03 LAB — POCT INR: INR: 1.7

## 2017-08-03 NOTE — Patient Instructions (Signed)
Description   Today Dec 31st take 2 tablets (5mg ) then continue taking 1.5 tablets (3.75 mg)  daily. .  Recheck INR in 2 weeks. [facility] [phone] Please call coumadin clinic with any new medications or any concerns

## 2017-08-04 ENCOUNTER — Other Ambulatory Visit: Payer: Self-pay

## 2017-08-04 ENCOUNTER — Encounter (HOSPITAL_COMMUNITY): Payer: Self-pay | Admitting: *Deleted

## 2017-08-04 ENCOUNTER — Ambulatory Visit (HOSPITAL_COMMUNITY)
Admission: EM | Admit: 2017-08-04 | Discharge: 2017-08-04 | Disposition: A | Discharge status: Home / Self Care | Payer: Medicare Other | Attending: Family Medicine | Admitting: Family Medicine

## 2017-08-04 DIAGNOSIS — N898 Other specified noninflammatory disorders of vagina: Secondary | ICD-10-CM | POA: Insufficient documentation

## 2017-08-04 DIAGNOSIS — Z79899 Other long term (current) drug therapy: Secondary | ICD-10-CM | POA: Insufficient documentation

## 2017-08-04 DIAGNOSIS — N949 Unspecified condition associated with female genital organs and menstrual cycle: Secondary | ICD-10-CM

## 2017-08-04 DIAGNOSIS — Z7901 Long term (current) use of anticoagulants: Secondary | ICD-10-CM | POA: Insufficient documentation

## 2017-08-04 MED ORDER — CLOTRIMAZOLE 1 % VA CREA: 1.0000 | TOPICAL_CREAM | Freq: Every day | VAGINAL | 0 refills | Status: AC

## 2017-08-04 NOTE — ED Provider Notes (Signed)
[facility]    CSN: [id number] Arrival date & time: 08/04/17  1602     History   Chief Complaint Chief Complaint  Patient presents with  . Vaginal Itching    HPI [name] is a 84 y.o. female presenting with vaginal burning for 1 day. Patient states she has been on a couple antibiotics since the beginning of December, developed vaginal burning since, denies itching. She has been using an "injectable" cream that she is unsure the name of it. Her daughter gave it to her. She says this was controlling the burning until she didn't use it. She is also using premarin and on provera, states she has vaginal bleeding but this is normal for her- Her PCP Dr. Clovis Riley is aware and monitors. Denies pain or loss of skin, denies oral lesions. Denies dysuria/frequency- had urine checked last Monday and it was clear.   HPI  Past Medical History:  Diagnosis Date  . Anxiety   . Atrial fibrillation (HCC)   . Burning mouth syndrome   . CAD (coronary artery disease)   . Coronary atherosclerosis of native coronary artery   . Dysphagia   . GERD (gastroesophageal reflux disease)   . HTN (hypertension)   . Insomnia     Patient Active Problem List   Diagnosis Date Noted  . Chronic diastolic heart failure (HCC) 09/20/2015  . CHF (congestive heart failure) (HCC) 08/28/2015  . Dyspnea 08/26/2015  . Fatigue 08/26/2015  . Leg edema 08/14/2014  . Cardiac pacemaker in situ 01/10/2014  . Encounter for therapeutic drug monitoring 09/06/2013  . HTN (hypertension)   . Anxiety   . GERD (gastroesophageal reflux disease)   . Insomnia   . Coronary atherosclerosis of native coronary artery   . Dysphagia   . CAD (coronary artery disease)   . Burning mouth syndrome   . Atrial fibrillation (HCC) 05/06/2013    Past Surgical History:  Procedure Laterality Date  . BREAST LUMPECTOMY    . KIDNEY STONE SURGERY    . TONSILLECTOMY      OB History    No data available       Home  Medications    Prior to Admission medications   Medication Sig Start Date End Date Taking? Authorizing Provider  Ascorbic Acid (VITAMIN C PO) Take 1 tablet by mouth daily.    [provider]  CALCIUM-VITAMIN D PO Take 1 tablet by mouth daily.    [provider]  cephALEXin (KEFLEX) 500 MG capsule Take 1 capsule (500 mg total) by mouth 4 (four) times daily. 07/11/17   Hayden Rasmussen, NP  CHOLECALCIFEROL PO Take 1 tablet by mouth daily.    [provider]  clonazePAM (KLONOPIN) 0.5 MG tablet Take 0.5 mg by mouth daily. 08/01/15   [provider]  clotrimazole (GYNE-LOTRIMIN) 1 % vaginal cream Place 1 Applicatorful vaginally at bedtime for 10 days. 08/04/17 08/14/17  Anelise Staron C, PA-C  Coenzyme Q10 (COQ10 PO) Take 1 capsule by mouth daily.    [provider]  diltiazem (TIAZAC) 300 MG 24 hr capsule TAKE 1 CAPSULE BY MOUTH ONCE DAILY 07/02/17   Corky Crafts, MD  fluconazole (DIFLUCAN) 150 MG tablet 1 tab po x 1. May repeat in 72 hours if no improvement 07/11/17   Hayden Rasmussen, NP  furosemide (LASIX) 40 MG tablet Take 1 tablet (40 mg total) daily by mouth. May take 1 additional tablet daily for increased weight/SOB and edema as directed. 06/18/17   Lance Muss  S, MD  losartan (COZAAR) 25 MG tablet TAKE 1 TABLET BY MOUTH ONCE DAILY 04/24/17   Corky Crafts, MD  LYRICA 50 MG capsule Take 2 capsules by mouth 2 (two) times daily.  04/26/13   [provider]  MAGNESIUM PO Take 1 tablet by mouth 2 (two) times daily.    [provider]  medroxyPROGESTERone (PROVERA) 10 MG tablet Take 1 tablet by mouth daily. 04/21/13   [provider]  Omega-3 Fatty Acids (FISH OIL PO) Take 1 capsule by mouth 2 (two) times daily.    [provider]  potassium chloride SA (KLOR-CON M20) 20 MEQ tablet Take 1 tablet (20 mEq total) by mouth daily. 12/23/16   Marinus Maw, MD  PREMARIN 0.625 MG tablet Take 0.625 mg by mouth daily.  08/28/15   [provider]  VITAMIN A PO Take 1 capsule by mouth daily.    [provider]  VITAMIN E PO Take 1 capsule by mouth daily.    [provider]  warfarin (COUMADIN) 2.5 MG tablet USE AS DIRECTED 07/27/17   Corky Crafts, MD    Family History Family History  Problem Relation Age of Onset  . Liver cancer Sister   . Leukemia Mother        ACUTE  . Bladder Cancer Father   . Hypertension Father   . Bladder Cancer Brother   . Bladder Cancer Paternal Grandfather   . Heart attack Neg Hx   . Stroke Neg Hx     Social History Social History   Tobacco Use  . Smoking status: Never Smoker  . Smokeless tobacco: Never Used  Substance Use Topics  . Alcohol use: No    Alcohol/week: 0.0 oz  . Drug use: No     Allergies   Cortisone and Metoprolol   Review of Systems Review of Systems  Constitutional: Negative for fever.  Respiratory: Negative for shortness of breath.   Cardiovascular: Negative for chest pain.  Gastrointestinal: Negative for abdominal pain, nausea and vomiting.  Genitourinary: Positive for vaginal bleeding. Negative for dysuria, frequency, vaginal discharge and vaginal pain.       Vaginal burning  Neurological: Negative for dizziness, syncope, light-headedness and headaches.     Physical Exam Triage Vital Signs ED Triage Vitals  Enc Vitals Group     BP 08/04/17 1718 111/67     Pulse Rate 08/04/17 1718 65     Resp --      Temp 08/04/17 1718 97.9 F (36.6 C)     Temp src --      SpO2 08/04/17 1718 97 %     Weight --      Height --      Head Circumference --      Peak Flow --      Pain Score 08/04/17 1719 3     Pain Loc --      Pain Edu? --      Excl. in GC? --    No data found.  Updated Vital Signs BP 111/67 (BP Location: Right Arm)   Pulse 65   Temp 97.9 F (36.6 C)   SpO2 97%    Physical Exam  Constitutional: She appears well-developed and well-nourished. No distress.  HENT:  Head: Normocephalic  and atraumatic.  Eyes: Conjunctivae are normal.  Neck: Neck supple.  Cardiovascular: Normal rate and regular rhythm.  No murmur heard. Pulmonary/Chest: Effort normal and breath sounds normal. No respiratory distress.  Abdominal: Soft. There is  no tenderness.  Genitourinary:  Genitourinary Comments: Vaginal vault with mucosa intact, blood around vaginal orifice and inside vaginal vault, no evidence of excoriation or increased erythema, no discharge observed besides bleeding. No sores.  Musculoskeletal: She exhibits no edema.  Neurological: She is alert.  Skin: Skin is warm and dry.  Psychiatric: She has a normal mood and affect.  Nursing note and vitals reviewed.    UC Treatments / Results  Labs (all labs ordered are listed, but only abnormal results are displayed) Labs Reviewed  CERVICOVAGINAL ANCILLARY ONLY    EKG  EKG Interpretation None       Radiology No results found.  Procedures Procedures (including critical care time)  Medications Ordered in UC Medications - No data to display   Initial Impression / Assessment and Plan / UC Course  I have reviewed the triage vital signs and the nursing notes.  Pertinent labs & imaging results that were available during my care of the patient were reviewed by me and considered in my medical decision making (see chart for details).     Vaginal swab for yeast/BV. Patient urinated earlier in visit and was unable to urinate again to check UA. Pelvic exam with mucosa intact, unlikely Steven-Johnsons. Will treat for yeast with clotrimazole cream, avoiding oral given possible increased bleeding risk with coumadin. Advised to follow up with primary or women's clinic for bleeding. Discussed strict return precautions. Patient verbalized understanding and is agreeable with plan.   Final Clinical Impressions(s) / UC Diagnoses   Final diagnoses:  Vaginal burning    ED Discharge Orders        Ordered    clotrimazole (GYNE-LOTRIMIN)  1 % vaginal cream  Daily at bedtime     08/04/17 1808       Controlled Substance Prescriptions [place] Controlled Substance Registry consulted? Not Applicable   Lew DawesWieters, Clydie Dillen C, [place] JerseyPA-C 08/04/17 [id number]

## 2017-08-04 NOTE — ED Triage Notes (Signed)
Per pt she feels like she is having a reaction with the antibiotic she received for her UTI. Per pt he is having burning in her vaginal region, per pt there is not discharge. Per pt husband, pt had been taking several antibiotics since December 8th.

## 2017-08-04 NOTE — Discharge Instructions (Signed)
Please use clotrimazole daily for burning, also continue premarin.   I would like you to follow up with OBGYN/Women's clinic for vaginal bleeding.

## 2017-08-05 LAB — CERVICOVAGINAL ANCILLARY ONLY
Bacterial vaginitis: NEGATIVE
Candida vaginitis: NEGATIVE

## 2017-08-18 ENCOUNTER — Ambulatory Visit (INDEPENDENT_AMBULATORY_CARE_PROVIDER_SITE_OTHER): Payer: Medicare Other | Admitting: *Deleted

## 2017-08-18 DIAGNOSIS — Z5181 Encounter for therapeutic drug level monitoring: Secondary | ICD-10-CM

## 2017-08-18 DIAGNOSIS — I4891 Unspecified atrial fibrillation: Secondary | ICD-10-CM | POA: Diagnosis not present

## 2017-08-18 LAB — POCT INR: INR: 5.2

## 2017-08-18 NOTE — Patient Instructions (Signed)
Description   Do not take coumadin today January 15th no coumadin on January 16th then on January 17 take only 1 tablet (2.5mg ) then  continue taking 1.5 tablets (3.75 mg)  daily. .  Recheck INR in 1 week. [facility] [phone] Please call coumadin clinic with any new medications or any concerns monitor for any bleeding and go to ER if seen Do good serving of greens today and tomorrow

## 2017-08-25 ENCOUNTER — Ambulatory Visit (INDEPENDENT_AMBULATORY_CARE_PROVIDER_SITE_OTHER): Payer: Medicare Other

## 2017-08-25 DIAGNOSIS — I4891 Unspecified atrial fibrillation: Secondary | ICD-10-CM | POA: Diagnosis not present

## 2017-08-25 DIAGNOSIS — Z5181 Encounter for therapeutic drug level monitoring: Secondary | ICD-10-CM | POA: Diagnosis not present

## 2017-08-25 LAB — POCT INR: INR: 2.1

## 2017-08-25 NOTE — Patient Instructions (Signed)
Description   Continue on same dosage 1.5 tablets (3.75 mg)  daily.  Recheck INR in 1 week. [facility] [phone] Please call coumadin clinic with any new medications

## 2017-08-29 DIAGNOSIS — L259 Unspecified contact dermatitis, unspecified cause: Secondary | ICD-10-CM | POA: Diagnosis not present

## 2017-08-29 DIAGNOSIS — N898 Other specified noninflammatory disorders of vagina: Secondary | ICD-10-CM | POA: Diagnosis not present

## 2017-08-31 ENCOUNTER — Ambulatory Visit (INDEPENDENT_AMBULATORY_CARE_PROVIDER_SITE_OTHER): Payer: Medicare Other | Admitting: *Deleted

## 2017-08-31 DIAGNOSIS — I4891 Unspecified atrial fibrillation: Secondary | ICD-10-CM

## 2017-08-31 DIAGNOSIS — Z5181 Encounter for therapeutic drug level monitoring: Secondary | ICD-10-CM | POA: Diagnosis not present

## 2017-08-31 LAB — POCT INR: INR: 2.9

## 2017-08-31 NOTE — Patient Instructions (Signed)
Description   Continue on same dosage 1.5 tablets (3.75 mg)  daily.  Recheck INR in 2 weeks. [facility] [phone] Please call coumadin clinic with any new medications

## 2017-09-02 ENCOUNTER — Ambulatory Visit (INDEPENDENT_AMBULATORY_CARE_PROVIDER_SITE_OTHER): Payer: Medicare Other | Admitting: *Deleted

## 2017-09-02 DIAGNOSIS — R001 Bradycardia, unspecified: Secondary | ICD-10-CM

## 2017-09-03 ENCOUNTER — Encounter: Payer: Self-pay | Admitting: Cardiology

## 2017-09-03 NOTE — Progress Notes (Signed)
Remote pacemaker transmission.   

## 2017-09-16 ENCOUNTER — Encounter: Payer: Self-pay | Admitting: Internal Medicine

## 2017-09-16 ENCOUNTER — Ambulatory Visit (INDEPENDENT_AMBULATORY_CARE_PROVIDER_SITE_OTHER): Payer: Medicare Other | Admitting: Internal Medicine

## 2017-09-16 VITALS — BP 120/58 | HR 73 | Ht 61.0 in | Wt 123.2 lb

## 2017-09-16 DIAGNOSIS — Z950 Presence of cardiac pacemaker: Secondary | ICD-10-CM

## 2017-09-16 DIAGNOSIS — I10 Essential (primary) hypertension: Secondary | ICD-10-CM

## 2017-09-16 DIAGNOSIS — I4891 Unspecified atrial fibrillation: Secondary | ICD-10-CM | POA: Diagnosis not present

## 2017-09-16 DIAGNOSIS — R001 Bradycardia, unspecified: Secondary | ICD-10-CM

## 2017-09-16 NOTE — Patient Instructions (Signed)

## 2017-09-16 NOTE — Progress Notes (Signed)
HPI [name] is a 84 y.o. female who has had AFib and HTN and symptomatic bradycardia, status post permanent pacemaker insertion. Her BP has been fairly well controlled.  She denies syncope, chest pain, or shortness of breath. She has done amazingly well despite her advanced age. No peripheral edema or palpitations. She has not been in the hospital over the past year. Allergies  Allergen Reactions  . Cortisone Other (See Comments)    REACTION: "STOMACH BURNS"  . Metoprolol Diarrhea     Current Outpatient Medications  Medication Sig Dispense Refill  . Ascorbic Acid (VITAMIN C PO) Take 1 tablet by mouth daily.    Marland Kitchen. CALCIUM-VITAMIN D PO Take 1 tablet by mouth daily.    . CHOLECALCIFEROL PO Take 1 tablet by mouth daily.    . clonazePAM (KLONOPIN) 0.5 MG tablet Take 0.5 mg by mouth daily.    . Coenzyme Q10 (COQ10 PO) Take 1 capsule by mouth daily.    Marland Kitchen. diltiazem (TIAZAC) 300 MG 24 hr capsule TAKE 1 CAPSULE BY MOUTH ONCE DAILY 90 capsule 2  . furosemide (LASIX) 40 MG tablet Take 1 tablet (40 mg total) daily by mouth. May take 1 additional tablet daily for increased weight/SOB and edema as directed. 135 tablet 2  . losartan (COZAAR) 25 MG tablet TAKE 1 TABLET BY MOUTH ONCE DAILY 90 tablet 3  . LYRICA 50 MG capsule Take 2 capsules by mouth 2 (two) times daily.     Marland Kitchen. MAGNESIUM PO Take 1 tablet by mouth 2 (two) times daily.    . medroxyPROGESTERone (PROVERA) 10 MG tablet Take 1 tablet by mouth daily.    . Omega-3 Fatty Acids (FISH OIL PO) Take 1 capsule by mouth 2 (two) times daily.    . potassium chloride SA (KLOR-CON M20) 20 MEQ tablet Take 1 tablet (20 mEq total) by mouth daily. 90 tablet 3  . PREMARIN 0.625 MG tablet Take 0.625 mg by mouth daily.    Marland Kitchen. VITAMIN A PO Take 1 capsule by mouth daily.    Marland Kitchen. VITAMIN E PO Take 1 capsule by mouth daily.    Marland Kitchen. warfarin (COUMADIN) 2.5 MG tablet USE AS DIRECTED 50 tablet 1   No current facility-administered medications for this visit.       Past Medical History:  Diagnosis Date  . Anxiety   . Atrial fibrillation (HCC)   . Burning mouth syndrome   . CAD (coronary artery disease)   . Coronary atherosclerosis of native coronary artery   . Dysphagia   . GERD (gastroesophageal reflux disease)   . HTN (hypertension)   . Insomnia     ROS:   All systems reviewed and negative except as noted in the HPI.   Past Surgical History:  Procedure Laterality Date  . BREAST LUMPECTOMY    . KIDNEY STONE SURGERY    . TONSILLECTOMY       Family History  Problem Relation Age of Onset  . Liver cancer Sister   . Leukemia Mother        ACUTE  . Bladder Cancer Father   . Hypertension Father   . Bladder Cancer Brother   . Bladder Cancer Paternal Grandfather   . Heart attack Neg Hx   . Stroke Neg Hx      Social History   Socioeconomic History  . Marital status: Married    Spouse name: Not on file  . Number of children: Not on file  . Years of education:  Not on file  . Highest education level: Not on file  Social Needs  . Financial resource strain: Not on file  . Food insecurity - worry: Not on file  . Food insecurity - inability: Not on file  . Transportation needs - medical: Not on file  . Transportation needs - non-medical: Not on file  Occupational History  . Not on file  Tobacco Use  . Smoking status: Never Smoker  . Smokeless tobacco: Never Used  Substance and Sexual Activity  . Alcohol use: No    Alcohol/week: 0.0 oz  . Drug use: No  . Sexual activity: Not on file  Other Topics Concern  . Not on file  Social History Narrative  . Not on file     BP (!) 120/58   Pulse 73   Ht 5\' 1"  (1.549 m)   Wt 123 lb 3.2 oz (55.9 kg)   BMI 23.28 kg/m   Physical Exam:  Well appearing elderly woman, NAD HEENT: Unremarkable Neck:  6 cm JVD, no thyromegally Lymphatics:  No adenopathy Back:  No CVA tenderness Lungs:  Clear with no wheezes HEART:  Regular rate rhythm, no murmurs, no rubs, no clicks Abd:   soft, positive bowel sounds, no organomegally, no rebound, no guarding Ext:  2 plus pulses, no edema, no cyanosis, no clubbing Skin:  No rashes no nodules Neuro:  CN II through XII intact, motor grossly intact  EKG - Nsr  DEVICE  Normal device function.  See PaceArt for details.   Assess/Plan: 1. Atrial fib - her ventricular rate appears to be well controlled. No change in meds. 2.PPM - her single chamber medtronic ppm is working normally. Will recheck in several months. 3. HTN - her blood pressure is well controlled. No change in her meds.   Leonia Reeves.D.

## 2017-09-18 DIAGNOSIS — R8290 Unspecified abnormal findings in urine: Secondary | ICD-10-CM | POA: Diagnosis not present

## 2017-09-18 DIAGNOSIS — R350 Frequency of micturition: Secondary | ICD-10-CM | POA: Diagnosis not present

## 2017-09-18 LAB — CUP PACEART INCLINIC DEVICE CHECK
Battery Impedance: 1266 Ohm
Battery Voltage: 2.77 V
Date Time Interrogation Session: 20190213183706
Implantable Lead Implant Date: 20111202
Implantable Pulse Generator Implant Date: 20111202
Lead Channel Impedance Value: 67 Ohm
Lead Channel Pacing Threshold Pulse Width: 0.4 ms
Lead Channel Setting Pacing Pulse Width: 0.4 ms
Lead Channel Setting Sensing Sensitivity: 4 mV
MDC IDC LEAD IMPLANT DT: 20111202
MDC IDC LEAD LOCATION: 753859
MDC IDC LEAD LOCATION: 753860
MDC IDC MSMT BATTERY REMAINING LONGEVITY: 53 mo
MDC IDC MSMT LEADCHNL RV IMPEDANCE VALUE: 512 Ohm
MDC IDC MSMT LEADCHNL RV PACING THRESHOLD AMPLITUDE: 0.5 V
MDC IDC MSMT LEADCHNL RV SENSING INTR AMPL: 8 mV
MDC IDC SET LEADCHNL RV PACING AMPLITUDE: 2.5 V
MDC IDC STAT BRADY RV PERCENT PACED: 30 %

## 2017-09-18 LAB — CUP PACEART REMOTE DEVICE CHECK
Battery Impedance: 1240 Ohm
Battery Remaining Longevity: 54 mo
Battery Voltage: 2.77 V
Brady Statistic RV Percent Paced: 30 %
Date Time Interrogation Session: 20190130182113
Implantable Lead Implant Date: 20111202
Implantable Lead Location: 753859
Implantable Lead Model: 5076
Lead Channel Setting Pacing Pulse Width: 0.4 ms
Lead Channel Setting Sensing Sensitivity: 4 mV
MDC IDC LEAD IMPLANT DT: 20111202
MDC IDC LEAD LOCATION: 753860
MDC IDC MSMT LEADCHNL RA IMPEDANCE VALUE: 67 Ohm
MDC IDC MSMT LEADCHNL RV IMPEDANCE VALUE: 508 Ohm
MDC IDC MSMT LEADCHNL RV PACING THRESHOLD AMPLITUDE: 0.625 V
MDC IDC MSMT LEADCHNL RV PACING THRESHOLD PULSEWIDTH: 0.4 ms
MDC IDC PG IMPLANT DT: 20111202
MDC IDC SET LEADCHNL RV PACING AMPLITUDE: 2.5 V

## 2017-09-29 ENCOUNTER — Ambulatory Visit (INDEPENDENT_AMBULATORY_CARE_PROVIDER_SITE_OTHER): Payer: Medicare Other | Admitting: Pharmacist Clinician (PhC)/ Clinical Pharmacy Specialist

## 2017-09-29 DIAGNOSIS — Z5181 Encounter for therapeutic drug level monitoring: Secondary | ICD-10-CM

## 2017-09-29 DIAGNOSIS — I4891 Unspecified atrial fibrillation: Secondary | ICD-10-CM

## 2017-09-29 LAB — POCT INR: INR: 3.4

## 2017-09-29 NOTE — Patient Instructions (Signed)
Description   Take only 1/2 tablet today Tuesday Feb 26, then continue with 1.5 tablets (3.75 mg)  daily.  Recheck INR in 2 weeks. [facility] Clinic [phone] Please call coumadin clinic with any new medications

## 2017-10-05 ENCOUNTER — Other Ambulatory Visit: Payer: Self-pay | Admitting: Interventional Cardiology

## 2017-10-14 ENCOUNTER — Ambulatory Visit (INDEPENDENT_AMBULATORY_CARE_PROVIDER_SITE_OTHER): Payer: Medicare Other | Admitting: Pharmacist Clinician (PhC)/ Clinical Pharmacy Specialist

## 2017-10-14 DIAGNOSIS — Z5181 Encounter for therapeutic drug level monitoring: Secondary | ICD-10-CM | POA: Diagnosis not present

## 2017-10-14 DIAGNOSIS — I4891 Unspecified atrial fibrillation: Secondary | ICD-10-CM | POA: Diagnosis not present

## 2017-10-14 LAB — POCT INR: INR: 2.4

## 2017-10-16 DIAGNOSIS — H2513 Age-related nuclear cataract, bilateral: Secondary | ICD-10-CM | POA: Diagnosis not present

## 2017-11-11 ENCOUNTER — Ambulatory Visit (INDEPENDENT_AMBULATORY_CARE_PROVIDER_SITE_OTHER): Payer: Medicare Other | Admitting: Pharmacist Clinician (PhC)/ Clinical Pharmacy Specialist

## 2017-11-11 DIAGNOSIS — I4891 Unspecified atrial fibrillation: Secondary | ICD-10-CM

## 2017-11-11 DIAGNOSIS — Z5181 Encounter for therapeutic drug level monitoring: Secondary | ICD-10-CM

## 2017-11-11 LAB — POCT INR: INR: 3.3

## 2017-11-11 NOTE — Patient Instructions (Signed)
Description   Take only 1/2 tablet today Wednesday April 10, then continue with 1.5 tablets (3.75 mg)  daily.  Recheck INR in 4 weeks. [facility] [phone] Please call coumadin clinic with any new medications

## 2017-12-02 ENCOUNTER — Ambulatory Visit (INDEPENDENT_AMBULATORY_CARE_PROVIDER_SITE_OTHER): Payer: Medicare Other | Admitting: Pharmacist

## 2017-12-02 ENCOUNTER — Telehealth: Payer: Self-pay | Admitting: Cardiology

## 2017-12-02 ENCOUNTER — Ambulatory Visit (INDEPENDENT_AMBULATORY_CARE_PROVIDER_SITE_OTHER): Payer: Medicare Other | Admitting: *Deleted

## 2017-12-02 DIAGNOSIS — R001 Bradycardia, unspecified: Secondary | ICD-10-CM

## 2017-12-02 DIAGNOSIS — I4891 Unspecified atrial fibrillation: Secondary | ICD-10-CM

## 2017-12-02 DIAGNOSIS — Z5181 Encounter for therapeutic drug level monitoring: Secondary | ICD-10-CM | POA: Diagnosis not present

## 2017-12-02 LAB — POCT INR: INR: 3.2

## 2017-12-02 NOTE — Telephone Encounter (Signed)
Spoke with pt and reminded pt of remote transmission that is due today. Pt verbalized understanding.   

## 2017-12-03 ENCOUNTER — Encounter: Payer: Self-pay | Admitting: Cardiology

## 2017-12-03 LAB — CUP PACEART REMOTE DEVICE CHECK
Battery Remaining Longevity: 47 mo
Brady Statistic RV Percent Paced: 42 %
Date Time Interrogation Session: 20190501162032
Implantable Lead Implant Date: 20111202
Implantable Lead Implant Date: 20111202
Implantable Lead Location: 753860
Implantable Lead Model: 5076
Implantable Lead Model: 5076
Lead Channel Impedance Value: 463 Ohm
Lead Channel Impedance Value: 67 Ohm
Lead Channel Pacing Threshold Amplitude: 0.625 V
Lead Channel Pacing Threshold Pulse Width: 0.4 ms
Lead Channel Setting Pacing Amplitude: 2.5 V
Lead Channel Setting Pacing Pulse Width: 0.4 ms
MDC IDC LEAD LOCATION: 753859
MDC IDC MSMT BATTERY IMPEDANCE: 1431 Ohm
MDC IDC MSMT BATTERY VOLTAGE: 2.76 V
MDC IDC PG IMPLANT DT: 20111202
MDC IDC SET LEADCHNL RV SENSING SENSITIVITY: 2.8 mV

## 2017-12-03 NOTE — Progress Notes (Signed)
Remote pacemaker transmission.   

## 2017-12-23 DIAGNOSIS — M2550 Pain in unspecified joint: Secondary | ICD-10-CM | POA: Diagnosis not present

## 2017-12-25 ENCOUNTER — Other Ambulatory Visit: Payer: Self-pay | Admitting: Sports Medicine

## 2017-12-25 DIAGNOSIS — M545 Low back pain: Secondary | ICD-10-CM

## 2017-12-25 DIAGNOSIS — M1612 Unilateral primary osteoarthritis, left hip: Secondary | ICD-10-CM | POA: Diagnosis not present

## 2018-01-02 ENCOUNTER — Other Ambulatory Visit: Payer: Self-pay | Admitting: Internal Medicine

## 2018-01-06 ENCOUNTER — Ambulatory Visit
Admission: RE | Admit: 2018-01-06 | Discharge: 2018-01-06 | Disposition: A | Discharge status: Home / Self Care | Payer: Medicare Other | Source: Ambulatory Visit | Attending: Sports Medicine | Admitting: Sports Medicine

## 2018-01-06 DIAGNOSIS — M545 Low back pain: Secondary | ICD-10-CM

## 2018-01-08 DIAGNOSIS — M545 Low back pain: Secondary | ICD-10-CM | POA: Diagnosis not present

## 2018-01-12 ENCOUNTER — Telehealth: Payer: Self-pay

## 2018-01-12 NOTE — Telephone Encounter (Signed)
Will fax clearance notification and coumadin recommendations to fax listed below and will remove from preop pool

## 2018-01-12 NOTE — Telephone Encounter (Signed)
   [facility] Pre-operative Risk Assessment    Request for surgical clearance:  1. What type of surgery is being performed? Lumbar Epidural Steroid Injection   2. When is this surgery scheduled? TBD  3. What type of clearance is required (medical clearance vs. Pharmacy clearance to hold med vs. Both)? Medical and pharmacy  4. Are there any medications that need to be held prior to surgery and how long? Coumadin?   5. Practice name and name of physician performing surgery? [facility]/ Dr. Laroy Apple   6. What is your office phone number [phone]. [phone]    7.   What is your office fax number        [phone] attn: XRAY  8.   Anesthesia type (None, local, MAC, general) ?   '

## 2018-01-12 NOTE — Telephone Encounter (Signed)
Patient with diagnosis of atrial fibrillation on warfarin for anticoagulation.    Procedure: epidural steroid injection Date of procedure: TBD  CHADS2-VASc score of  6 (CHF, HTN, AGE, , CAD, AGE, female)  CrCl 32.3 Platelet count 159 (Jan 2017)  Per office protocol, patient can hold warfarin for 5 days prior to procedure.    Patient will not need bridging with Lovenox (enoxaparin) around procedure.

## 2018-01-12 NOTE — Telephone Encounter (Signed)
   Primary Cardiologist: Lance MussJayadeep Varanasi, MD  Chart reviewed as part of pre-operative protocol coverage. Given past medical history and time since last visit, based on ACC/AHA guidelines, [name] would be at acceptable risk for the planned procedure without further cardiovascular testing.   routing to pharmacy to address coumadin.    Stacy LisBrittainy Kyler Germer, PA-C 01/12/2018, 3:26 PM

## 2018-01-14 ENCOUNTER — Encounter

## 2018-01-14 ENCOUNTER — Ambulatory Visit (INDEPENDENT_AMBULATORY_CARE_PROVIDER_SITE_OTHER): Payer: Medicare Other | Admitting: Pharmacist

## 2018-01-14 DIAGNOSIS — I4891 Unspecified atrial fibrillation: Secondary | ICD-10-CM

## 2018-01-14 DIAGNOSIS — Z5181 Encounter for therapeutic drug level monitoring: Secondary | ICD-10-CM | POA: Diagnosis not present

## 2018-01-14 LAB — POCT INR: INR: 2.3 (ref 2.0–3.0)

## 2018-01-21 NOTE — Progress Notes (Signed)
Cardiology Office Note   Date:  01/22/2018   ID:  [name], DOB [birth year], MRN [id number]  PCP:  Asencion GowdaMitchell, L.August Saucerean, MD    No chief complaint on file.  AFib  Wt Readings from Last 3 Encounters:  01/22/18 113 lb (51.3 kg)  09/16/17 123 lb 3.2 oz (55.9 kg)  03/23/17 127 lb 6.4 oz (57.8 kg)       History of Present Illness: Stacy CholCarolyn M Hughston is a 84 y.o. female  who has had AFib and HTN.  SHe has a pacer folowed by Dr. Ladona Ridgelaylor.  She has beenhospitalized for fluid overload in the past. She spent about 2 days in the hospital. She was diuresed. She has been on a more consistent dose of Lasix. She weighs yourself daily. She minimizes salt. Overall, she has done very well since being in the hospital.   She moved in with her daughter.  Her husband is at [facility].    She had trouble with her legs walking up stairs.  SHe stays in the first floor now.    BP at home is typically well controlled when she checks at home.    Denies : Chest pain. Dizziness. Leg edema. Nitroglycerin use. Orthopnea. Palpitations. Paroxysmal nocturnal dyspnea. Shortness of breath. Syncope.   No falls.     Past Medical History:  Diagnosis Date  . Anxiety   . Atrial fibrillation (HCC)   . Burning mouth syndrome   . CAD (coronary artery disease)   . Coronary atherosclerosis of native coronary artery   . Dysphagia   . GERD (gastroesophageal reflux disease)   . HTN (hypertension)   . Insomnia     Past Surgical History:  Procedure Laterality Date  . BREAST LUMPECTOMY    . KIDNEY STONE SURGERY    . TONSILLECTOMY       Current Outpatient Medications  Medication Sig Dispense Refill  . Ascorbic Acid (VITAMIN C PO) Take 1 tablet by mouth daily.    Marland Kitchen. CALCIUM-VITAMIN D PO Take 1 tablet by mouth daily.    . CHOLECALCIFEROL PO Take 1 tablet by mouth daily.    . clonazePAM (KLONOPIN) 0.5 MG tablet Take 0.5 mg by mouth daily.    . Coenzyme Q10 (COQ10 PO) Take 1 capsule by mouth daily.      Marland Kitchen. diltiazem (TIAZAC) 300 MG 24 hr capsule TAKE 1 CAPSULE BY MOUTH ONCE DAILY 90 capsule 2  . furosemide (LASIX) 40 MG tablet Take 1 tablet (40 mg total) daily by mouth. May take 1 additional tablet daily for increased weight/SOB and edema as directed. 135 tablet 2  . losartan (COZAAR) 25 MG tablet TAKE 1 TABLET BY MOUTH ONCE DAILY 90 tablet 3  . LYRICA 50 MG capsule Take 2 capsules by mouth 2 (two) times daily.     Marland Kitchen. MAGNESIUM PO Take 1 tablet by mouth 2 (two) times daily.    . medroxyPROGESTERone (PROVERA) 10 MG tablet Take 1 tablet by mouth daily.    . Omega-3 Fatty Acids (FISH OIL PO) Take 1 capsule by mouth 2 (two) times daily.    . potassium chloride SA (K-DUR,KLOR-CON) 20 MEQ tablet TAKE 1 TABLET BY MOUTH ONCE DAILY 30 tablet 11  . PREMARIN 0.625 MG tablet Take 0.625 mg by mouth daily.    Marland Kitchen. VITAMIN A PO Take 1 capsule by mouth daily.    Marland Kitchen. VITAMIN E PO Take 1 capsule by mouth daily.    Marland Kitchen. warfarin (COUMADIN) 2.5 MG tablet TAKE AS  DIRECTED 50 tablet 2   No current facility-administered medications for this visit.     Allergies:   Cortisone and Metoprolol    Social History:  The patient  reports that she has never smoked. She has never used smokeless tobacco. She reports that she does not drink alcohol or use drugs.   Family History:  The patient's family history includes Bladder Cancer in her brother, father, and paternal grandfather; Hypertension in her father; Leukemia in her mother; Liver cancer in her sister.    ROS:  Please see the history of present illness.   Otherwise, review of systems are positive for mild LE edema.   All other systems are reviewed and negative.    PHYSICAL EXAM: VS:  BP (!) 122/40   Pulse 85   Ht 5\' 1"  (1.549 m)   Wt 113 lb (51.3 kg)   SpO2 95%   BMI 21.35 kg/m  , BMI Body mass index is 21.35 kg/m. GEN: Well nourished, well developed, in no acute distress  HEENT: normal  Neck: no JVD, carotid bruits, or masses Cardiac: irregularly irregular; no  murmurs, rubs, or gallops,no edema  Respiratory:  clear to auscultation bilaterally, normal work of breathing GI: soft, nontender, nondistended, + BS MS: no deformity or atrophy  Skin: warm and dry, no rash Neuro:  Strength and sensation are intact Psych: euthymic mood, full affect   EKG:   The ekg ordered today demonstrates AFib, rate controlled   Recent Labs: No results found for requested labs within last 8760 hours.   Lipid Panel No results found for: CHOL, TRIG, HDL, CHOLHDL, VLDL, LDLCALC, LDLDIRECT   Other studies Reviewed: Additional studies/ records that were reviewed today with results demonstrating: .   ASSESSMENT AND PLAN:  1. Atrial fibrillation: Rate controlled.  Warfarin for stroke prevention.  The current medical regimen is effective;  continue present plan and medications 2. HTN: The current medical regimen is effective;  continue present plan and medications.  BP well controlled.  3. Edema/Chronic diastolic heart failure: Stable.  Appears euvolemic.  The current medical regimen is effective;  continue present plan and medications. 4. Pacer: Followup checks with Dr. Ladona Ridgel.  5. Skin discoloration: venous insufficiency and prior Amio use.  Elevate legs when possible.    Current medicines are reviewed at length with the patient today.  The patient concerns regarding her medicines were addressed.  The following changes have been made:  No change  Labs/ tests ordered today include:  No orders of the defined types were placed in this encounter.   Recommend 150 minutes/week of aerobic exercise Low fat, low carb, high fiber diet recommended  Disposition:   FU in 1 year   Signed, Lance Muss, MD  01/22/2018 2:40 PM    [facility] [address] [place], [place], [place]  [address] Phone: [phone]; Fax: [phone]

## 2018-01-22 ENCOUNTER — Ambulatory Visit (INDEPENDENT_AMBULATORY_CARE_PROVIDER_SITE_OTHER): Payer: Medicare Other | Admitting: Interventional Cardiology

## 2018-01-22 ENCOUNTER — Encounter: Payer: Self-pay | Admitting: Interventional Cardiology

## 2018-01-22 VITALS — BP 122/40 | HR 85 | Ht 61.0 in | Wt 113.0 lb

## 2018-01-22 DIAGNOSIS — I4819 Other persistent atrial fibrillation: Secondary | ICD-10-CM

## 2018-01-22 DIAGNOSIS — R600 Localized edema: Secondary | ICD-10-CM | POA: Diagnosis not present

## 2018-01-22 DIAGNOSIS — I5032 Chronic diastolic (congestive) heart failure: Secondary | ICD-10-CM

## 2018-01-22 DIAGNOSIS — Z950 Presence of cardiac pacemaker: Secondary | ICD-10-CM | POA: Diagnosis not present

## 2018-01-22 DIAGNOSIS — I10 Essential (primary) hypertension: Secondary | ICD-10-CM | POA: Diagnosis not present

## 2018-01-22 DIAGNOSIS — I481 Persistent atrial fibrillation: Secondary | ICD-10-CM | POA: Diagnosis not present

## 2018-01-22 NOTE — Patient Instructions (Signed)

## 2018-01-25 ENCOUNTER — Other Ambulatory Visit: Payer: Self-pay | Admitting: Interventional Cardiology

## 2018-02-05 ENCOUNTER — Ambulatory Visit (INDEPENDENT_AMBULATORY_CARE_PROVIDER_SITE_OTHER): Payer: Medicare Other | Admitting: Pharmacist

## 2018-02-05 DIAGNOSIS — I4891 Unspecified atrial fibrillation: Secondary | ICD-10-CM | POA: Diagnosis not present

## 2018-02-05 DIAGNOSIS — Z5181 Encounter for therapeutic drug level monitoring: Secondary | ICD-10-CM | POA: Diagnosis not present

## 2018-02-05 LAB — POCT INR: INR: 1.5 — AB (ref 2.0–3.0)

## 2018-02-05 NOTE — Patient Instructions (Signed)
HOLD warfarin dose until July/8, then resume warfarin on July/9th at 1.5 tablets (3.75 mg)  daily.  Recheck INR on 02/24/2018. [facility] [phone] Please call coumadin clinic with any new medications

## 2018-02-08 DIAGNOSIS — M1612 Unilateral primary osteoarthritis, left hip: Secondary | ICD-10-CM | POA: Diagnosis not present

## 2018-02-08 DIAGNOSIS — M545 Low back pain: Secondary | ICD-10-CM | POA: Diagnosis not present

## 2018-02-24 ENCOUNTER — Ambulatory Visit (INDEPENDENT_AMBULATORY_CARE_PROVIDER_SITE_OTHER): Payer: Medicare Other | Admitting: Pharmacist

## 2018-02-24 DIAGNOSIS — Z5181 Encounter for therapeutic drug level monitoring: Secondary | ICD-10-CM | POA: Diagnosis not present

## 2018-02-24 DIAGNOSIS — I4891 Unspecified atrial fibrillation: Secondary | ICD-10-CM

## 2018-02-24 LAB — POCT INR: INR: 3.9 — AB (ref 2.0–3.0)

## 2018-03-03 ENCOUNTER — Encounter: Payer: Medicare Other | Admitting: *Deleted

## 2018-03-03 ENCOUNTER — Telehealth: Payer: Self-pay

## 2018-03-03 NOTE — Telephone Encounter (Signed)
Attempted to confirm remote transmission with pt. No answer and was unable to leave a message.   

## 2018-03-04 ENCOUNTER — Encounter: Payer: Self-pay | Admitting: Cardiology

## 2018-03-04 NOTE — Progress Notes (Signed)
Letter  

## 2018-03-08 ENCOUNTER — Ambulatory Visit (INDEPENDENT_AMBULATORY_CARE_PROVIDER_SITE_OTHER): Payer: Medicare Other | Admitting: Pharmacist

## 2018-03-08 DIAGNOSIS — I4891 Unspecified atrial fibrillation: Secondary | ICD-10-CM

## 2018-03-08 DIAGNOSIS — Z5181 Encounter for therapeutic drug level monitoring: Secondary | ICD-10-CM | POA: Diagnosis not present

## 2018-03-08 LAB — POCT INR: INR: 2.5 (ref 2.0–3.0)

## 2018-03-10 ENCOUNTER — Ambulatory Visit (INDEPENDENT_AMBULATORY_CARE_PROVIDER_SITE_OTHER): Payer: Medicare Other | Admitting: *Deleted

## 2018-03-10 DIAGNOSIS — I4891 Unspecified atrial fibrillation: Secondary | ICD-10-CM

## 2018-03-11 NOTE — Progress Notes (Signed)
Remote pacemaker transmission.   

## 2018-03-30 LAB — CUP PACEART REMOTE DEVICE CHECK
Battery Impedance: 1545 Ohm
Battery Voltage: 2.77 V
Brady Statistic RV Percent Paced: 45 %
Date Time Interrogation Session: 20190806155947
Implantable Lead Implant Date: 20111202
Implantable Lead Location: 753859
Implantable Pulse Generator Implant Date: 20111202
Lead Channel Impedance Value: 67 Ohm
Lead Channel Setting Pacing Amplitude: 2.5 V
Lead Channel Setting Pacing Pulse Width: 0.4 ms
Lead Channel Setting Sensing Sensitivity: 2.8 mV
MDC IDC LEAD IMPLANT DT: 20111202
MDC IDC LEAD LOCATION: 753860
MDC IDC MSMT BATTERY REMAINING LONGEVITY: 44 mo
MDC IDC MSMT LEADCHNL RV IMPEDANCE VALUE: 475 Ohm
MDC IDC MSMT LEADCHNL RV PACING THRESHOLD AMPLITUDE: 0.625 V
MDC IDC MSMT LEADCHNL RV PACING THRESHOLD PULSEWIDTH: 0.4 ms

## 2018-04-15 ENCOUNTER — Ambulatory Visit (INDEPENDENT_AMBULATORY_CARE_PROVIDER_SITE_OTHER): Payer: Medicare Other | Admitting: Pharmacist

## 2018-04-15 DIAGNOSIS — Z5181 Encounter for therapeutic drug level monitoring: Secondary | ICD-10-CM | POA: Diagnosis not present

## 2018-04-15 DIAGNOSIS — I4891 Unspecified atrial fibrillation: Secondary | ICD-10-CM | POA: Diagnosis not present

## 2018-04-15 LAB — POCT INR: INR: 3.3 — AB (ref 2.0–3.0)

## 2018-04-17 ENCOUNTER — Other Ambulatory Visit: Payer: Self-pay | Admitting: Interventional Cardiology

## 2018-05-10 ENCOUNTER — Other Ambulatory Visit: Payer: Self-pay

## 2018-05-10 MED ORDER — LOSARTAN POTASSIUM 25 MG PO TABS: 25.0000 mg | ORAL_TABLET | Freq: Every day | ORAL | 7 refills | Status: AC

## 2018-05-18 ENCOUNTER — Telehealth: Payer: Self-pay | Admitting: Interventional Cardiology

## 2018-05-18 DIAGNOSIS — I4891 Unspecified atrial fibrillation: Secondary | ICD-10-CM

## 2018-05-18 DIAGNOSIS — Z5181 Encounter for therapeutic drug level monitoring: Secondary | ICD-10-CM

## 2018-05-18 NOTE — Telephone Encounter (Signed)
LMOM for daughter to call back - we can fax, mail lab order to her.

## 2018-05-18 NOTE — Telephone Encounter (Signed)
New Message         Patient's daughter is calling today to get and order for her mom to get her coumadin. Patient is out of town living with her daughter for a little.

## 2018-05-19 ENCOUNTER — Telehealth: Payer: Self-pay | Admitting: Interventional Cardiology

## 2018-05-19 ENCOUNTER — Other Ambulatory Visit: Payer: Self-pay | Admitting: *Deleted

## 2018-05-19 DIAGNOSIS — Z5181 Encounter for therapeutic drug level monitoring: Secondary | ICD-10-CM | POA: Diagnosis not present

## 2018-05-19 DIAGNOSIS — I4891 Unspecified atrial fibrillation: Secondary | ICD-10-CM | POA: Diagnosis not present

## 2018-05-19 MED ORDER — FUROSEMIDE 40 MG PO TABS: 40.0000 mg | ORAL_TABLET | Freq: Every day | ORAL | 2 refills | Status: AC

## 2018-05-19 NOTE — Telephone Encounter (Signed)
See previous note

## 2018-05-19 NOTE — Telephone Encounter (Signed)
New message   Patient daughter wants to know if an order can be placed for lab work at [facility] at [address]. In [place], [place]. Please call to discuss.

## 2018-05-19 NOTE — Telephone Encounter (Signed)
New message      *STAT* If patient is at the pharmacy, call can be transferred to refill team.   1. Which medications need to be refilled? (please list name of each medication and dose if known) furosemide (LASIX) 40 MG tablet  2. Which pharmacy/location (including street and city if local pharmacy) is medication to be sent to?[facility] [address] [place]  3. Do they need a 30 day or 90 day supply? 90

## 2018-05-19 NOTE — Telephone Encounter (Signed)
Order faxed to [facility] @ [phone] as requested by daughter.  LMOM to notify patient.

## 2018-05-20 ENCOUNTER — Other Ambulatory Visit: Payer: Self-pay | Admitting: Pharmacist Clinician (PhC)/ Clinical Pharmacy Specialist

## 2018-05-20 LAB — PROTIME-INR
INR: 2.2 — AB (ref 0.8–1.2)
PROTHROMBIN TIME: 21.6 s — AB (ref 9.1–12.0)

## 2018-05-20 MED ORDER — WARFARIN SODIUM 2.5 MG PO TABS: ORAL_TABLET | ORAL | 2 refills | Status: AC

## 2018-05-21 ENCOUNTER — Ambulatory Visit (INDEPENDENT_AMBULATORY_CARE_PROVIDER_SITE_OTHER): Payer: Medicare Other | Admitting: Pharmacist Clinician (PhC)/ Clinical Pharmacy Specialist

## 2018-05-21 DIAGNOSIS — I4891 Unspecified atrial fibrillation: Secondary | ICD-10-CM

## 2018-05-21 DIAGNOSIS — Z5181 Encounter for therapeutic drug level monitoring: Secondary | ICD-10-CM

## 2018-06-09 ENCOUNTER — Ambulatory Visit (INDEPENDENT_AMBULATORY_CARE_PROVIDER_SITE_OTHER): Payer: Medicare Other | Admitting: *Deleted

## 2018-06-09 DIAGNOSIS — I5032 Chronic diastolic (congestive) heart failure: Secondary | ICD-10-CM

## 2018-06-09 DIAGNOSIS — I4819 Other persistent atrial fibrillation: Secondary | ICD-10-CM

## 2018-06-09 NOTE — Progress Notes (Signed)
Remote pacemaker transmission.   

## 2018-06-13 ENCOUNTER — Encounter: Payer: Self-pay | Admitting: Cardiology

## 2018-06-14 DIAGNOSIS — N3000 Acute cystitis without hematuria: Secondary | ICD-10-CM | POA: Diagnosis not present

## 2018-06-17 ENCOUNTER — Other Ambulatory Visit: Payer: Self-pay | Admitting: Pharmacist

## 2018-06-17 ENCOUNTER — Ambulatory Visit (INDEPENDENT_AMBULATORY_CARE_PROVIDER_SITE_OTHER): Payer: Medicare Other | Admitting: Pharmacist Clinician (PhC)/ Clinical Pharmacy Specialist

## 2018-06-17 DIAGNOSIS — Z5181 Encounter for therapeutic drug level monitoring: Secondary | ICD-10-CM

## 2018-06-17 DIAGNOSIS — I4891 Unspecified atrial fibrillation: Secondary | ICD-10-CM | POA: Diagnosis not present

## 2018-06-17 LAB — PROTIME-INR
INR: 3.6 — ABNORMAL HIGH (ref 0.8–1.2)
Prothrombin Time: 33.6 s — ABNORMAL HIGH (ref 9.1–12.0)

## 2018-06-26 DIAGNOSIS — R404 Transient alteration of awareness: Secondary | ICD-10-CM | POA: Diagnosis not present

## 2018-06-26 DIAGNOSIS — Z7901 Long term (current) use of anticoagulants: Secondary | ICD-10-CM | POA: Diagnosis not present

## 2018-06-26 DIAGNOSIS — G936 Cerebral edema: Secondary | ICD-10-CM | POA: Diagnosis not present

## 2018-06-26 DIAGNOSIS — I4891 Unspecified atrial fibrillation: Secondary | ICD-10-CM | POA: Diagnosis not present

## 2018-06-26 DIAGNOSIS — G9389 Other specified disorders of brain: Secondary | ICD-10-CM | POA: Diagnosis not present

## 2018-06-26 DIAGNOSIS — R410 Disorientation, unspecified: Secondary | ICD-10-CM | POA: Diagnosis not present

## 2018-06-26 DIAGNOSIS — R52 Pain, unspecified: Secondary | ICD-10-CM | POA: Diagnosis not present

## 2018-06-26 DIAGNOSIS — I639 Cerebral infarction, unspecified: Secondary | ICD-10-CM | POA: Diagnosis not present

## 2018-06-26 DIAGNOSIS — Z79899 Other long term (current) drug therapy: Secondary | ICD-10-CM | POA: Diagnosis not present

## 2018-06-26 DIAGNOSIS — R29810 Facial weakness: Secondary | ICD-10-CM | POA: Diagnosis not present

## 2018-06-27 DIAGNOSIS — Z7901 Long term (current) use of anticoagulants: Secondary | ICD-10-CM | POA: Diagnosis not present

## 2018-06-27 DIAGNOSIS — I63512 Cerebral infarction due to unspecified occlusion or stenosis of left middle cerebral artery: Secondary | ICD-10-CM | POA: Diagnosis not present

## 2018-06-27 DIAGNOSIS — I63232 Cerebral infarction due to unspecified occlusion or stenosis of left carotid arteries: Secondary | ICD-10-CM | POA: Diagnosis not present

## 2018-06-27 DIAGNOSIS — I7090 Unspecified atherosclerosis: Secondary | ICD-10-CM | POA: Diagnosis not present

## 2018-06-27 DIAGNOSIS — J690 Pneumonitis due to inhalation of food and vomit: Secondary | ICD-10-CM | POA: Diagnosis not present

## 2018-06-27 DIAGNOSIS — Z515 Encounter for palliative care: Secondary | ICD-10-CM | POA: Diagnosis not present

## 2018-06-27 DIAGNOSIS — J9601 Acute respiratory failure with hypoxia: Secondary | ICD-10-CM | POA: Diagnosis not present

## 2018-06-27 DIAGNOSIS — I10 Essential (primary) hypertension: Secondary | ICD-10-CM | POA: Diagnosis not present

## 2018-06-27 DIAGNOSIS — R0602 Shortness of breath: Secondary | ICD-10-CM | POA: Diagnosis not present

## 2018-06-27 DIAGNOSIS — I480 Paroxysmal atrial fibrillation: Secondary | ICD-10-CM | POA: Diagnosis not present

## 2018-06-27 DIAGNOSIS — I63522 Cerebral infarction due to unspecified occlusion or stenosis of left anterior cerebral artery: Secondary | ICD-10-CM | POA: Diagnosis not present

## 2018-06-27 DIAGNOSIS — R0989 Other specified symptoms and signs involving the circulatory and respiratory systems: Secondary | ICD-10-CM | POA: Diagnosis not present

## 2018-06-27 DIAGNOSIS — I739 Peripheral vascular disease, unspecified: Secondary | ICD-10-CM | POA: Diagnosis not present

## 2018-06-27 DIAGNOSIS — I6602 Occlusion and stenosis of left middle cerebral artery: Secondary | ICD-10-CM | POA: Diagnosis not present

## 2018-06-27 DIAGNOSIS — R29727 NIHSS score 27: Secondary | ICD-10-CM | POA: Diagnosis present

## 2018-06-27 DIAGNOSIS — G8191 Hemiplegia, unspecified affecting right dominant side: Secondary | ICD-10-CM | POA: Diagnosis not present

## 2018-06-27 DIAGNOSIS — Z950 Presence of cardiac pacemaker: Secondary | ICD-10-CM | POA: Diagnosis not present

## 2018-06-27 DIAGNOSIS — I272 Pulmonary hypertension, unspecified: Secondary | ICD-10-CM | POA: Diagnosis present

## 2018-06-27 DIAGNOSIS — R05 Cough: Secondary | ICD-10-CM | POA: Diagnosis not present

## 2018-06-27 DIAGNOSIS — R52 Pain, unspecified: Secondary | ICD-10-CM | POA: Diagnosis not present

## 2018-06-27 DIAGNOSIS — R532 Functional quadriplegia: Secondary | ICD-10-CM | POA: Diagnosis not present

## 2018-06-27 DIAGNOSIS — R918 Other nonspecific abnormal finding of lung field: Secondary | ICD-10-CM | POA: Diagnosis not present

## 2018-06-27 DIAGNOSIS — R1310 Dysphagia, unspecified: Secondary | ICD-10-CM | POA: Diagnosis not present

## 2018-06-27 DIAGNOSIS — I517 Cardiomegaly: Secondary | ICD-10-CM | POA: Diagnosis not present

## 2018-06-27 DIAGNOSIS — I639 Cerebral infarction, unspecified: Secondary | ICD-10-CM | POA: Diagnosis not present

## 2018-06-27 DIAGNOSIS — G936 Cerebral edema: Secondary | ICD-10-CM | POA: Diagnosis not present

## 2018-06-27 DIAGNOSIS — R4701 Aphasia: Secondary | ICD-10-CM | POA: Diagnosis present

## 2018-06-27 DIAGNOSIS — R402212 Coma scale, best verbal response, none, at arrival to emergency department: Secondary | ICD-10-CM | POA: Diagnosis not present

## 2018-06-27 DIAGNOSIS — I083 Combined rheumatic disorders of mitral, aortic and tricuspid valves: Secondary | ICD-10-CM | POA: Diagnosis not present

## 2018-06-27 DIAGNOSIS — G9389 Other specified disorders of brain: Secondary | ICD-10-CM | POA: Diagnosis not present

## 2018-06-27 DIAGNOSIS — F5104 Psychophysiologic insomnia: Secondary | ICD-10-CM | POA: Diagnosis present

## 2018-06-27 DIAGNOSIS — Z9282 Status post administration of tPA (rtPA) in a different facility within the last 24 hours prior to admission to current facility: Secondary | ICD-10-CM | POA: Diagnosis not present

## 2018-06-27 DIAGNOSIS — Z79899 Other long term (current) drug therapy: Secondary | ICD-10-CM | POA: Diagnosis not present

## 2018-06-27 DIAGNOSIS — J9811 Atelectasis: Secondary | ICD-10-CM | POA: Diagnosis not present

## 2018-06-27 DIAGNOSIS — I70201 Unspecified atherosclerosis of native arteries of extremities, right leg: Secondary | ICD-10-CM | POA: Diagnosis present

## 2018-06-27 DIAGNOSIS — Z7189 Other specified counseling: Secondary | ICD-10-CM | POA: Diagnosis not present

## 2018-06-27 DIAGNOSIS — I63312 Cerebral infarction due to thrombosis of left middle cerebral artery: Secondary | ICD-10-CM | POA: Diagnosis not present

## 2018-06-27 DIAGNOSIS — I63412 Cerebral infarction due to embolism of left middle cerebral artery: Secondary | ICD-10-CM | POA: Diagnosis not present

## 2018-07-04 DEATH — deceased

## 2018-07-08 ENCOUNTER — Ambulatory Visit: Payer: Self-pay | Admitting: Pharmacist Clinician (PhC)/ Clinical Pharmacy Specialist

## 2018-07-08 DIAGNOSIS — I4891 Unspecified atrial fibrillation: Secondary | ICD-10-CM

## 2018-07-08 DIAGNOSIS — Z5181 Encounter for therapeutic drug level monitoring: Secondary | ICD-10-CM

## 2018-08-13 LAB — CUP PACEART REMOTE DEVICE CHECK
Battery Impedance: 1659 Ohm
Battery Remaining Longevity: 42 mo
Battery Voltage: 2.76 V
Brady Statistic RV Percent Paced: 38 %
Date Time Interrogation Session: 20191106171131
Implantable Lead Implant Date: 20111202
Implantable Lead Implant Date: 20111202
Implantable Lead Location: 753859
Implantable Lead Location: 753860
Implantable Lead Model: 5076
Implantable Lead Model: 5076
Implantable Pulse Generator Implant Date: 20111202
Lead Channel Impedance Value: 528 Ohm
Lead Channel Impedance Value: 67 Ohm
Lead Channel Pacing Threshold Amplitude: 0.5 V
Lead Channel Pacing Threshold Pulse Width: 0.4 ms
Lead Channel Setting Pacing Amplitude: 2.5 V
Lead Channel Setting Pacing Pulse Width: 0.4 ms
Lead Channel Setting Sensing Sensitivity: 4 mV
